# Patient Record
Sex: Female | Born: 1950 | Race: Black or African American | Hispanic: No | Marital: Single | Smoking: Current every day smoker
Health system: Southern US, Community
[De-identification: ages and names within clinical notes are randomized; demographics above are authoritative.]

## PROBLEM LIST (undated history)

## (undated) DIAGNOSIS — F32A Depression, unspecified: Secondary | ICD-10-CM

## (undated) DIAGNOSIS — K219 Gastro-esophageal reflux disease without esophagitis: Secondary | ICD-10-CM

## (undated) DIAGNOSIS — F419 Anxiety disorder, unspecified: Secondary | ICD-10-CM

## (undated) DIAGNOSIS — F329 Major depressive disorder, single episode, unspecified: Secondary | ICD-10-CM

## (undated) DIAGNOSIS — I10 Essential (primary) hypertension: Secondary | ICD-10-CM

## (undated) DIAGNOSIS — E785 Hyperlipidemia, unspecified: Secondary | ICD-10-CM

## (undated) HISTORY — DX: Essential (primary) hypertension: I10

## (undated) HISTORY — DX: Hyperlipidemia, unspecified: E78.5

## (undated) HISTORY — DX: Major depressive disorder, single episode, unspecified: F32.9

## (undated) HISTORY — DX: Depression, unspecified: F32.A

## (undated) HISTORY — DX: Anxiety disorder, unspecified: F41.9

## (undated) HISTORY — DX: Gastro-esophageal reflux disease without esophagitis: K21.9

---

## 2004-09-09 ENCOUNTER — Ambulatory Visit: Payer: Self-pay

## 2006-03-16 ENCOUNTER — Ambulatory Visit: Payer: Self-pay

## 2009-03-18 ENCOUNTER — Emergency Department: Payer: Self-pay | Admitting: Emergency Medicine

## 2011-06-21 ENCOUNTER — Emergency Department: Payer: Self-pay | Admitting: Emergency Medicine

## 2012-08-08 DIAGNOSIS — D259 Leiomyoma of uterus, unspecified: Secondary | ICD-10-CM | POA: Insufficient documentation

## 2013-09-06 ENCOUNTER — Ambulatory Visit: Payer: Self-pay | Admitting: Gastroenterology

## 2013-09-09 LAB — PATHOLOGY REPORT

## 2014-01-01 DIAGNOSIS — K219 Gastro-esophageal reflux disease without esophagitis: Secondary | ICD-10-CM | POA: Insufficient documentation

## 2014-06-13 ENCOUNTER — Emergency Department: Payer: Self-pay | Admitting: Emergency Medicine

## 2015-08-20 DIAGNOSIS — I890 Lymphedema, not elsewhere classified: Secondary | ICD-10-CM | POA: Insufficient documentation

## 2015-08-23 ENCOUNTER — Ambulatory Visit: Payer: Medicare HMO | Attending: Orthopaedic Surgery | Admitting: Occupational Therapy

## 2015-08-23 ENCOUNTER — Encounter: Payer: Self-pay | Admitting: Occupational Therapy

## 2015-08-23 VITALS — Ht 66.0 in | Wt 265.0 lb

## 2015-08-23 DIAGNOSIS — I890 Lymphedema, not elsewhere classified: Secondary | ICD-10-CM

## 2015-08-24 NOTE — Therapy (Signed)
[place] MAIN [facility] SERVICES [address] [place], [place], [address] Phone: [phone]   Fax:  [phone]  Occupational Therapy Evaluation  Patient Details  Name: Heather Hartman MRN: [id number] Date of Birth: [birth year] No Data Recorded  Encounter Date: 08/23/2015      OT End of Session - 08/24/15 1553    Visit Number 1   Number of Visits 36   Date for OT Re-Evaluation 11/23/15   OT Start Time 1100   OT Stop Time 1201   OT Time Calculation (min) 61 min   Equipment Utilized During Treatment Lymphedema Self-Care Workbook   Activity Tolerance Patient tolerated treatment well;No increased pain   Behavior During Therapy [facility] for tasks assessed/performed      Past Medical History  Diagnosis Date  . Anxiety   . GERD (gastroesophageal reflux disease)   . Hypertension   . Hyperlipidemia   . Depression     History reviewed. No pertinent past surgical history.  Filed Vitals:   08/23/15 1121  Height: 5\' 6"  (1.676 m)  Weight: 265 lb (120.203 kg)        Subjective Assessment - 08/24/15 1259    Subjective  (p) Pt is referred for OT evaluation and treatment of RLE lymphedema by Purcell Nails, MD of [facility]. Pt denies prior LE treatment and does not currently wear compression garments. Pt reports sudden onset of RLE swelling after nbite between small and 4th toe on the R foot ~ 10 years ago. Bite resulted in cellulitis infection requiring week Goin hospitalization. Pt states swelling has been ongoing since onset and has worsened over time. It does not resolve w/ elevation.   Pertinent History (p) Obesity, Tobacco abuse, plantar fasciitis   Limitations (p) decreased basic and instrumental ADL performance, difficulty standing and walking, decreased balance 2/2 body assymetry, difficuty fitting lower body clothing and street shoes, decreased social participation   Patient Stated Goals (p) reduce swelling and pai. keep them  from getting worse soI can do more   Currently in Pain? 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   Onset Date 05/25/04   Prior Therapy none   Balance Screen   Has the patient fallen in the past 6 months No   Prior Function   Level of Independence Independent   Vocation Retired   Leisure family gathering and social participation with friends in community   ADL   Lower Body Dressing Other (comment)  RLE limb swelling limits ability to fit street shoes and B c   IADL   Shopping Shops independently for [employer]   Written Expression   Dominant Hand Right   Observation/Other Assessments   Observations by visual assessment RLE ~ 25% > in volume than LLE below knee   Skin Integrity Moderate, dense, spongy palpable fibrosis in RLE below knee. Tissue is non pitting with strong positive stemmer. Skin is well hydrated          LYMPHEDEMA/ONCOLOGY QUESTIONNAIRE - 08/24/15 1602    What other symptoms do you have   Are you Having Heaviness or Tightness Yes   Are you having Pain Yes   Are you having pitting edema No   Is it Hard or Difficult finding clothes that fit Yes   Do you have infections  Yes   Stemmer Sign Yes   Lymphedema Stage   Stage STAGE 2 SPONTANEOUSLY IRREVERSIBLE   Lymphedema Assessments   Lymphedema Assessments Lower extremities   Right Lower Extremity Lymphedema   Other BLE comparative limb volumetrics TBA Rx visit 1                       OT Education - 08/24/15 1553    Education provided Yes   Education Details Provided Pt/caregiver skilled education and ADL training throughout visit for lymphedema etiology, progression, and treatment including Intensive and Management Phase Complete Decongestive Therapy (CDT)  Discussed lymphedema precautions, cellulitis risk, and all CDT and LE self-care components, including compression wrapping/  garments & devices, lymphatic pumping ther ex, simple self-MLD, and skin care. Provided printed Lymphedema Workbook for reference.   Person(s) Educated Patient   Methods Explanation;Demonstration;Tactile cues;Verbal cues;Handout   Comprehension Verbalized understanding;Need further instruction             OT [place] Term Goals - 08/24/15 1605    OT [place] TERM GOAL #1   Title Lymphedema (LE) self-care: Pt able to apply multi layered, gradient compression wraps independently within 2 weeks to achieve optimal limb volume reduction.   Baseline dependent   Time 2   Period Weeks   Status New   OT Odenthal TERM GOAL #2   Title Pt to achieve at least 10% RLE limb volume reductions during Intensive CDT to limit LE progression, decrease infection risk and fall risk, to reduce pain/ discomfort, and to improve safe ambulation and functional mobility.   Baseline dependent   Time 12   Period Weeks   Status New   OT [place] TERM GOAL #3   Title Pt >/= 85 % compliant with all daily, LE self-care protocols for home program, including simple self-manual lymphatic drainage (MLD), skin care, lymphatic pumping the ex, skin care, and donning/ doffing compression wraps and garments with needed level of caregiver assistance to limit LE progression and further functional decline.     Baseline dependent   Time 12   Period Weeks   Status New   OT [place] TERM GOAL #4   Title Pt to tolerate daily compression wraps, garments and devices in keeping w/ prescribed wear regime within 1 week of issue date to progress and retain clinical and functional gains and to limit LE progression.   Baseline dependent   Time 12   Period Weeks   Status New   OT [place]i TERM GOAL #5   Title During Management Phase CDT Pt to sustain limb volume reductions achieved during Intensive Phase CDT within 5% utilizing LE self-care protocols, appropriate compression garments/ devices, and needed level of caregiver assistance.   Baseline dependent    Time 6   Period Months   Status New               Plan - 08/24/15 1610    Clinical Impression Statement Pt presents with moderate, stage II, RLE lymphedema (LE) 2/2 unidentified insect bite ~ 10 years ago.  Chronic, progressive, BLE LE limits Pt's functional performance of basic and instrumental ADLs, participation in productive and leisure activities, life role performance, functional mobility and ambulation, and participation in social and community activities. Skilled Occupational Therapy for LE care is medically necessary to reduce uncontrolled swelling and associated pain, to decrease infection and falls risk, to improve knowledge and performance of LE self-care, to fit with appropriate, accessible, custom, BLE compression garments and devices, and to  limit further progression. Without skilled Occupational Therapy for Intensive and Management phase Complete Decongestive Therapy (CDT) this patient's condition is likely to worsen and further functional decline is expected.   Rehab Potential Good   Clinical Impairments Affecting Rehab Potential obesity, dense tissue fibrosis, chronic pain, plantar fasciitis   OT Frequency 3x / week   OT Duration 12 weeks   OT Treatment/Interventions Self-care/ADL training;DME and/or AE instruction;Manual lymph drainage;Patient/family education;Compression bandaging;Therapeutic exercises;Therapeutic exercise;Therapeutic activities;Manual Therapy   Plan fit w/ ccl 3 Elvarex knee high- flat knit custom stocking, with ccl 2 Elvarex toe cap CCl 2, and convoluted foam knee length HOS device to limit fibrosis formation during HOS- consider Reid classic or Comfort w/ upgraded foam.   OT Home Exercise Plan lymphatic pumping therex- 2 x q day- 10 reps in order bilaterally   Recommended Other Services daily skin care  w/ low pH lotion   Consulted and Agree with Plan of Care Patient      Patient will benefit from skilled therapeutic intervention in order to improve  the following deficits and impairments:  Abnormal gait, Decreased knowledge of use of DME, Decreased skin integrity, Increased edema, Impaired flexibility, Pain, Decreased mobility, Decreased activity tolerance, Decreased range of motion, Decreased balance, Decreased knowledge of precautions, Difficulty walking  Visit Diagnosis: Lymphedema, not elsewhere classified      G-Codes - Sep 21, 2015 1604    Functional Assessment Tool Used Clinical observation and examination, medical records review, Pt and caregiver interview, comparative limb volumetrics   Functional Limitation Self care   Self Care Current Status [id number]) 100 percent impaired, limited or restricted   Self Care Goal Status [id number]) At least 1 percent but less than 20 percent impaired, limited or restricted      Problem List There are no active problems to display for this patient.   Ansel Bong 09-21-2015, 4:19 PM  [place] MAIN [facility] SERVICES [address] [place], [place], [address] Phone: [phone]   Fax:  [phone]  Name: Heather Hartman MRN: [id number] Date of Birth: 02/11/1951

## 2015-08-24 NOTE — Patient Instructions (Signed)

## 2015-08-29 ENCOUNTER — Ambulatory Visit: Payer: Medicare HMO | Attending: Orthopaedic Surgery | Admitting: Occupational Therapy

## 2015-08-29 DIAGNOSIS — I890 Lymphedema, not elsewhere classified: Secondary | ICD-10-CM | POA: Diagnosis present

## 2015-08-29 NOTE — Patient Instructions (Signed)
LE instructions and precautions as established- see initial eval.   

## 2015-08-29 NOTE — Therapy (Signed)
[place] MAIN [facility] SERVICES [address] [place], [place], [address] Phone: [phone]   Fax:  [phone]  Occupational Therapy Treatment  Patient Details  Name: Heather Hartman MRN: [id number] Date of Birth: 09/14/1950 No Data Recorded  Encounter Date: 08/29/2015      OT End of Session - 08/29/15 1528    Visit Number 2   Number of Visits 36   Date for OT Re-Evaluation 11/23/15   OT Start Time 1119   OT Stop Time 1224   OT Time Calculation (min) 65 min   Equipment Utilized During Treatment Lymphedema Self-Care Workbook   Activity Tolerance Patient tolerated treatment well;No increased pain   Behavior During Therapy [facility] for tasks assessed/performed      Past Medical History  Diagnosis Date  . Anxiety   . GERD (gastroesophageal reflux disease)   . Hypertension   . Hyperlipidemia   . Depression     No past surgical history on file.  There were no vitals filed for this visit.           LYMPHEDEMA/ONCOLOGY QUESTIONNAIRE - 08/29/15 1531    Right Lower Extremity Lymphedema   Other RLE A-D ( below knee) limb volume = 6,455.35 ml. A-G (toes - mid thigh) volume = 8,732.21 ml   Other Limb volume differential (LVD) measures 26.58%, R>L, below the knee, and  15.51%  from toes to mid thjigh. Typical WNL LVD is usually , 5%.   Left Lower Extremity Lymphedema   Other lLE A-D ( below knee) limb volume = 4737.14 ml. A-G (toes - mid thigh) volume = 7377.69 mlRLE A-D ( below knee) limb volume = 6,455.35 ml. A-G (toes - mid thigh) volume = 8,732.21 ml                 OT Treatments/Exercises (OP) - 08/29/15 0001    ADLs   ADL Education Given Yes   Manual Therapy   Manual Therapy Edema management;Compression Bandaging   Manual therapy comments Completed BLE comparative limb volumetrics   Compression Bandaging RLE gradient compression wraps applied from toes to below knee: toe wrap x1 under cotton stockinett; 8 cm x 5 m x 1 to  foot and ankle, 10 cm  x 5 m x 1, 12 cm x5 cm x 1, applied circumferentially in custommary layered gradient configuration  over .04 x 10 cm x 5 m Rosidol Soft x 1.5 .rolls.                 OT Education - 08/29/15 1523    Education provided Yes   Education Details Emphasis of LE ADL training today on patient edu for proper compression wraps application using  circumferential, gradient techniques and proper positioning.    Person(s) Educated Patient   Methods Explanation;Demonstration;Handout   Comprehension Verbalized understanding;Need further instruction             OT [place] Term Goals - 08/24/15 1605    OT Eisman TERM GOAL #1   Title Lymphedema (LE) self-care: Pt able to apply multi layered, gradient compression wraps independently within 2 weeks to achieve optimal limb volume reduction.   Baseline dependent   Time 2   Period Weeks   Status New   OT Se[place] TERM GOAL #2   Title Pt to achieve at least 10% RLE limb volume reductions during Intensive CDT to limit LE progression, decrease infection risk and fall risk, to reduce pain/ discomfort, and to improve safe ambulation and functional  mobility.   Baseline dependent   Time 12   Period Weeks   Status New   OT [place] TERM GOAL #3   Title Pt >/= 85 % compliant with all daily, LE self-care protocols for home program, including simple self-manual lymphatic drainage (MLD), skin care, lymphatic pumping the ex, skin care, and donning/ doffing compression wraps and garments with needed level of caregiver assistance to limit LE progression and further functional decline.     Baseline dependent   Time 12   Period Weeks   Status New   OT [place]rette TERM GOAL #4   Title Pt to tolerate daily compression wraps, garments and devices in keeping w/ prescribed wear regime within 1 week of issue date to progress and retain clinical and functional gains and to limit LE progression.   Baseline dependent   Time 12   Period Weeks   Status New   OT  [place] TERM GOAL #5   Title During Management Phase CDT Pt to sustain limb volume reductions achieved during Intensive Phase CDT within 5% utilizing LE self-care protocols, appropriate compression garments/ devices, and needed level of caregiver assistance.   Baseline dependent   Time 6   Period Months   Status New               Plan - 08/29/15 1528    Clinical Impression Statement Pt tolerated anatomical measurements and knee length RLE compression wraps in clinic today w/out difficulty. Initial comparative volumetrics reveal pretty significant 26.58% limb volume differential, R>L, below the knee verifying moderate  lymphedema designation. Next session we'll continue Pt edu for self wrapping.   Rehab Potential Good   Clinical Impairments Affecting Rehab Potential obesity, dense tissue fibrosis, chronic pain, plantar fasciitis   OT Frequency 3x / week   OT Duration 12 weeks   OT Treatment/Interventions Self-care/ADL training;DME and/or AE instruction;Manual lymph drainage;Patient/family education;Compression bandaging;Therapeutic exercises;Therapeutic exercise;Therapeutic activities;Manual Therapy   OT Home Exercise Plan lymphatic pumping therex- 2 x q day- 10 reps in order bilaterally   Consulted and Agree with Plan of Care Patient      Patient will benefit from skilled therapeutic intervention in order to improve the following deficits and impairments:  Abnormal gait, Decreased knowledge of use of DME, Decreased skin integrity, Increased edema, Impaired flexibility, Pain, Decreased mobility, Decreased activity tolerance, Decreased range of motion, Decreased balance, Decreased knowledge of precautions, Difficulty walking  Visit Diagnosis: Lymphedema, not elsewhere classified    Problem List There are no active problems to display for this patient.  Andrey Spearman, MS, OTR/L, [facility] 08/29/2015 3:38 PM  [place] MAIN [facility] SERVICES [address] [place], [place], [address] Phone: [phone]   Fax:  [phone]  Name: Heather Hartman MRN: [id number] Date of Birth: 03-Jan-1951

## 2015-08-31 ENCOUNTER — Ambulatory Visit: Payer: Medicare HMO | Admitting: Occupational Therapy

## 2015-08-31 DIAGNOSIS — I890 Lymphedema, not elsewhere classified: Secondary | ICD-10-CM

## 2015-08-31 NOTE — Patient Instructions (Signed)
LE instructions and precautions as established- see initial eval.   

## 2015-08-31 NOTE — Therapy (Signed)
[place] MAIN [facility] SERVICES [address] [place], [place], [address] Phone: [phone]   Fax:  [phone]  Occupational Therapy Treatment  Patient Details  Name: Heather Hartman MRN: [id number] Date of Birth: 1950/06/08 No Data Recorded  Encounter Date: 08/31/2015      OT End of Session - 08/31/15 1220    Visit Number 3   Number of Visits 36   Date for OT Re-Evaluation 11/23/15   OT Start Time 1102   OT Stop Time 1214   OT Time Calculation (min) 72 min   Equipment Utilized During Treatment Lymphedema Self-Care Workbook   Activity Tolerance Patient tolerated treatment well;No increased pain   Behavior During Therapy [facility] for tasks assessed/performed      Past Medical History  Diagnosis Date  . Anxiety   . GERD (gastroesophageal reflux disease)   . Hypertension   . Hyperlipidemia   . Depression     No past surgical history on file.  There were no vitals filed for this visit.      Subjective Assessment - 08/31/15 1221    Subjective  Pt presents for OT visist 3 to treat RLE secondary lymphedema with compression wraps in place. Pt tolerated wraps during visit interval with minimal difficulty.Emphasis of visit today is on teaching LE self care.   Pertinent History Obesity, Tobacco abuse, plantar fasciitis   Limitations decreased basic and instrumental ADL performance, difficulty standing and walking, decreased balance 2/2 body assymetry, difficuty fitting lower body clothing and street shoes, decreased social participation   Patient Stated Goals reduce swelling and pai. keep them from getting worse soI can do more                      OT Treatments/Exercises (OP) - 08/31/15 0001    ADLs   ADL Education Given Yes   Manual Therapy   Manual Therapy Edema management;Compression Bandaging   Compression Bandaging RLE gradient compression wraps applied from toes to below knee: toe wrap x1 under cotton stockinett; 8 cm x  5 m x 1 to foot and ankle, 10 cm  x 5 m x 1, 12 cm x5 cm x 1, applied circumferentially in custommary layered gradient configuration  over .04 x 10 cm x 5 m Rosidol Soft x 1.5 .rolls.                 OT Education - 08/31/15 1219    Education provided Yes   Education Details Emphasis today on proper gradient compression application using proper technique, and on BLE ymphatic pumping there ex including seated, standing and supine variations. All hand outs given.Good return for there ex.   Person(s) Educated Patient   Methods Explanation;Demonstration;Tactile cues;Verbal cues;Handout   Comprehension Verbalized understanding;Returned demonstration;Verbal cues required;Tactile cues required;Need further instruction             OT [place] Term Goals - 08/24/15 1605    OT [place]ner TERM GOAL #1   Title Lymphedema (LE) self-care: Pt able to apply multi layered, gradient compression wraps independently within 2 weeks to achieve optimal limb volume reduction.   Baseline dependent   Time 2   Period Weeks   Status New   OT [place] TERM GOAL #2   Title Pt to achieve at least 10% RLE limb volume reductions during Intensive CDT to limit LE progression, decrease infection risk and fall risk, to reduce pain/ discomfort, and to improve safe ambulation and functional mobility.   Baseline  dependent   Time 12   Period Weeks   Status New   OT [place] TERM GOAL #3   Title Pt >/= 85 % compliant with all daily, LE self-care protocols for home program, including simple self-manual lymphatic drainage (MLD), skin care, lymphatic pumping the ex, skin care, and donning/ doffing compression wraps and garments with needed level of caregiver assistance to limit LE progression and further functional decline.     Baseline dependent   Time 12   Period Weeks   Status New   OT [place] TERM GOAL #4   Title Pt to tolerate daily compression wraps, garments and devices in keeping w/ prescribed wear regime within 1 week of issue  date to progress and retain clinical and functional gains and to limit LE progression.   Baseline dependent   Time 12   Period Weeks   Status New   OT Nell TERM GOAL #5   Title During Management Phase CDT Pt to sustain limb volume reductions achieved during Intensive Phase CDT within 5% utilizing LE self-care protocols, appropriate compression garments/ devices, and needed level of caregiver assistance.   Baseline dependent   Time 6   Period Months   Status New               Plan - 08/31/15 1221    Clinical Impression Statement Upon removing compression wraps this morning Limb volume is increased in R foot is clearly increased as Pt pushed toe wraps up underneath foot bandages into arch area. Leg appears unchanged volumetrically and density  is not changed palpably. By end of session Pt was able to apply compression wraps to  knee w/ max assist. Pt independent with lymphatic pumping ther ex aftewr skilled training/ Pt will practice over the weekend. Since she lives less than 10 minutes from clinic she will change her bathing routine to mornings  so she wont have to rewrap on treatment days.   Rehab Potential Good   Clinical Impairments Affecting Rehab Potential obesity, dense tissue fibrosis, chronic pain, plantar fasciitis   OT Frequency 3x / week   OT Duration 12 weeks   OT Treatment/Interventions Self-care/ADL training;DME and/or AE instruction;Manual lymph drainage;Patient/family education;Compression bandaging;Therapeutic exercises;Therapeutic exercise;Therapeutic activities;Manual Therapy   OT Home Exercise Plan lymphatic pumping therex- 2 x q day- 10 reps in order bilaterally   Consulted and Agree with Plan of Care Patient      Patient will benefit from skilled therapeutic intervention in order to improve the following deficits and impairments:  Abnormal gait, Decreased knowledge of use of DME, Decreased skin integrity, Increased edema, Impaired flexibility, Pain, Decreased  mobility, Decreased activity tolerance, Decreased range of motion, Decreased balance, Decreased knowledge of precautions, Difficulty walking  Visit Diagnosis: Lymphedema, not elsewhere classified    Problem List There are no active problems to display for this patient.   Andrey Spearman, MS, OTR/L, [facility] 08/31/2015 12:26 PM  [place] MAIN [facility] SERVICES [address] [place], [place], [address] Phone: [phone]   Fax:  [phone]  Name: Heather Hartman MRN: [id number] Date of Birth: [birth year]

## 2015-09-03 ENCOUNTER — Ambulatory Visit: Payer: Medicare HMO | Admitting: Occupational Therapy

## 2015-09-05 ENCOUNTER — Ambulatory Visit: Payer: Medicare HMO | Admitting: Occupational Therapy

## 2015-09-05 DIAGNOSIS — I890 Lymphedema, not elsewhere classified: Secondary | ICD-10-CM

## 2015-09-05 NOTE — Patient Instructions (Signed)
LE instructions and precautions as established- see initial eval.   

## 2015-09-05 NOTE — Therapy (Signed)
[place] MAIN [facility] SERVICES [address] [place], [place], [address] Phone: [phone]   Fax:  [phone]  Occupational Therapy Treatment  Patient Details  Name: Heather Hartman MRN: [id number] Date of Birth: 01-19-1951 No Data Recorded  Encounter Date: 09/05/2015      OT End of Session - 09/05/15 1216    Visit Number 4   Number of Visits 36   Date for OT Re-Evaluation 11/23/15   OT Start Time 1115   OT Stop Time 1200   OT Time Calculation (min) 45 min   Equipment Utilized During Treatment Lymphedema Self-Care Workbook   Activity Tolerance Patient tolerated treatment well;No increased pain   Behavior During Therapy [facility] for tasks assessed/performed      Past Medical History  Diagnosis Date  . Anxiety   . GERD (gastroesophageal reflux disease)   . Hypertension   . Hyperlipidemia   . Depression     No past surgical history on file.  There were no vitals filed for this visit.      Subjective Assessment - 09/05/15 1213    Subjective  Pt presents for OT visist 4 to treat RLE secondary lymphedema with compression wraps in place. Pt reports wraps got lose and came off a few hours after last visit.   Pertinent History Obesity, Tobacco abuse, plantar fasciitis   Limitations decreased basic and instrumental ADL performance, difficulty standing and walking, decreased balance 2/2 body assymetry, difficuty fitting lower body clothing and street shoes, decreased social participation   Patient Stated Goals reduce swelling and pai. keep them from getting worse soI can do more   Currently in Pain? No/denies                      OT Treatments/Exercises (OP) - 09/05/15 0001    ADLs   ADL Education Given Yes   Manual Therapy   Manual Therapy Edema management;Compression Bandaging   Compression Bandaging RLE gradient compression wraps applied from toes to below knee: toe wrap x1 under cotton stockinett; 8 cm x 5 m x 1 to foot  and ankle, 10 cm  x 5 m x 1, 12 cm x5 cm x 1, applied circumferentially in custommary layered gradient configuration  over .04 x 10 cm x 5 m Rosidol Soft x 1.5 .rolls.                 OT Education - 09/05/15 1214    Education provided Yes   Education Details Continued LE ADL training today for proper compression wraps application using  circumferential, gradient techniques and proper positioning.     Person(s) Educated Patient   Methods Explanation;Demonstration;Tactile cues;Verbal cues;Handout   Comprehension Verbalized understanding;Returned demonstration;Verbal cues required;Tactile cues required;Need further instruction             OT Michiels Term Goals - 08/24/15 1605    OT [place] TERM GOAL #1   Title Lymphedema (LE) self-care: Pt able to apply multi layered, gradient compression wraps independently within 2 weeks to achieve optimal limb volume reduction.   Baseline dependent   Time 2   Period Weeks   Status New   OT Mc[place]e TERM GOAL #2   Title Pt to achieve at least 10% RLE limb volume reductions during Intensive CDT to limit LE progression, decrease infection risk and fall risk, to reduce pain/ discomfort, and to improve safe ambulation and functional mobility.   Baseline dependent   Time 12   Period Weeks  Status New   OT Todt TERM GOAL #3   Title Pt >/= 85 % compliant with all daily, LE self-care protocols for home program, including simple self-manual lymphatic drainage (MLD), skin care, lymphatic pumping the ex, skin care, and donning/ doffing compression wraps and garments with needed level of caregiver assistance to limit LE progression and further functional decline.     Baseline dependent   Time 12   Period Weeks   Status New   OT Gravelle TERM GOAL #4   Title Pt to tolerate daily compression wraps, garments and devices in keeping w/ prescribed wear regime within 1 week of issue date to progress and retain clinical and functional gains and to limit LE progression.    Baseline dependent   Time 12   Period Weeks   Status New   OT Dobie TERM GOAL #5   Title During Management Phase CDT Pt to sustain limb volume reductions achieved during Intensive Phase CDT within 5% utilizing LE self-care protocols, appropriate compression garments/ devices, and needed level of caregiver assistance.   Baseline dependent   Time 6   Period Months   Status New               Plan - 09/05/15 1217    Clinical Impression Statement No change in swelling or tissue integrity today. Wraps became lose and fell off soon after treatment on Monday, which I suspect was 2/2 to rapid lymphatic decongestiion. Pt did not attempt to re-apply. By end of session pt able to apply wraps using proper techniques with moderate assistance..   Rehab Potential Good   Clinical Impairments Affecting Rehab Potential obesity, dense tissue fibrosis, chronic pain, plantar fasciitis   OT Frequency 3x / week   OT Duration 12 weeks   OT Treatment/Interventions Self-care/ADL training;DME and/or AE instruction;Manual lymph drainage;Patient/family education;Compression bandaging;Therapeutic exercises;Therapeutic exercise;Therapeutic activities;Manual Therapy   OT Home Exercise Plan lymphatic pumping therex- 2 x q day- 10 reps in order bilaterally   Consulted and Agree with Plan of Care Patient      Patient will benefit from skilled therapeutic intervention in order to improve the following deficits and impairments:  Abnormal gait, Decreased knowledge of use of DME, Decreased skin integrity, Increased edema, Impaired flexibility, Pain, Decreased mobility, Decreased activity tolerance, Decreased range of motion, Decreased balance, Decreased knowledge of precautions, Difficulty walking  Visit Diagnosis: Lymphedema, not elsewhere classified    Problem List There are no active problems to display for this patient.   Heather Spearman, MS, OTR/L, [facility] 09/05/2015 12:19 PM  [place] MAIN [facility] SERVICES [address] [place], [place], [address] Phone: [phone]   Fax:  [phone]  Name: Heather Hartman MRN: [id number] Date of Birth: Jan 27, 1951

## 2015-09-07 ENCOUNTER — Ambulatory Visit: Payer: Medicare HMO | Admitting: Occupational Therapy

## 2015-09-10 ENCOUNTER — Ambulatory Visit: Payer: Medicare HMO | Admitting: Occupational Therapy

## 2015-09-12 ENCOUNTER — Ambulatory Visit: Payer: Medicare HMO | Admitting: Occupational Therapy

## 2015-09-12 ENCOUNTER — Encounter: Payer: Self-pay | Admitting: Occupational Therapy

## 2015-09-12 DIAGNOSIS — I890 Lymphedema, not elsewhere classified: Secondary | ICD-10-CM

## 2015-09-12 NOTE — Therapy (Deleted)
[place] MAIN [facility] SERVICES [address] [place], [place], [address] Phone: [phone]   Fax:  [phone]  Sep 12, 2015     Occupational Therapy Discharge Summary   Patient: Heather Hartman MRN: [id number] Date of Birth: 01-16-1951  Diagnosis: Lymphedema, not elsewhere classified  No Data Recorded  The above patient had been seen in Occupational Therapy *** times of *** treatments scheduled with *** no shows and *** cancellations.  The treatment consisted of *** The patient is: {improved/worse/unchanged:[id number]}  Subjective: ***  Discharge Findings: ***  Functional Status at Discharge: ***  {TMLYY:[id number]}      Plan - 09/12/15 0954    Clinical Impression Statement Heather Hartman is discharged from Occupational Therapy for lymphedema care to address BLE swelling.. Pt had attended 4 of 36 visits. Pt cancelled remaining scheduled appointments stating that it was not helpful to her. She was educated on LE self-care, but she had not mastered any needed skills tat DC. Pt  was informed of  lymphedema precautions and  was provided with The Lymphedema Self-Care Workbook for reference. No OT goals met. Pt remains an appropriate candidate for treatment. Should she wish to return to OT for LE care, we will need a new perscription. Thank you for your referral.   Rehab Potential Good   Clinical Impairments Affecting Rehab Potential obesity, dense tissue fibrosis, chronic pain, plantar fasciitis   OT Frequency 3x / week   OT Duration 12 weeks   OT Treatment/Interventions Self-care/ADL training;DME and/or AE instruction;Manual lymph drainage;Patient/family education;Compression bandaging;Therapeutic exercises;Therapeutic exercise;Therapeutic activities;Manual Therapy   OT Home Exercise Plan lymphatic pumping therex- 2 x q day- 10 reps in order bilaterally   Consulted and Agree with Plan of Care Patient      Sincerely,  Andrey Spearman,  MS, OTR/L, [facility] 09/12/2015 10:04 AM  CC @CCLISTRESTNAME @  [place] [address] [place], [place], [address] Phone: [phone]   Fax:  [phone]  Patient: Heather Hartman MRN: [id number] Date of Birth: [birth year]

## 2015-09-12 NOTE — Therapy (Signed)
[place] MAIN [facility] SERVICES [address] [place], [place], [address] Phone: [phone]   Fax:  [phone]  Occupational Therapy Discharge Summary  Patient Details  Name: Heather Hartman MRN: [id number] Date of Birth: [birth year] No Data Recorded  Encounter Date: 09/12/2015    Past Medical History  Diagnosis Date  . Anxiety   . GERD (gastroesophageal reflux disease)   . Hypertension   . Hyperlipidemia   . Depression     No past surgical history on file.  There were no vitals filed for this visit.      Subjective Assessment - 09/12/15 0950    Subjective  Pt telephoned Rehab scheduler and csancelled all OT appointments for lymphedema care stating, " it is not helping me."   Pertinent History Obesity, Tobacco abuse, plantar fasciitis   Limitations decreased basic and instrumental ADL performance, difficulty standing and walking, decreased balance 2/2 body assymetry, difficuty fitting lower body clothing and street shoes, decreased social participation   Patient Stated Goals reduce swelling and pai. keep them from getting worse soI can do more                              OT Education - 09/12/15 0952    Education provided No   Education Details 2 initial treatment sessions devoted to pt education for LE self care, including self compression wrapping.Pat last visit.t had not mastered skills    Person(s) Educated Patient             OT Reeder Term Goals - 09/12/15 1000    OT [place]esk[place] TERM GOAL #1   Title Lymphedema (LE) self-care: Pt able to apply multi layered, gradient compression wraps independently within 2 weeks to achieve optimal limb volume reduction.   Baseline dependent   Time 2   Period Weeks   Status Not Met   OT Donner TERM GOAL #2   Title Pt to achieve at least 10% RLE limb volume reductions during Intensive CDT to limit LE progression, decrease infection risk and fall risk, to reduce pain/  discomfort, and to improve safe ambulation and functional mobility.   Baseline dependent   Time 12   Period Weeks   Status Not Met   OT Pasch TERM GOAL #3   Title Pt >/= 85 % compliant with all daily, LE self-care protocols for home program, including simple self-manual lymphatic drainage (MLD), skin care, lymphatic pumping the ex, skin care, and donning/ doffing compression wraps and garments with needed level of caregiver assistance to limit LE progression and further functional decline.     Baseline dependent   Time 12   Period Weeks   Status Not Met   OT [place] TERM GOAL #4   Title Pt to tolerate daily compression wraps, garments and devices in keeping w/ prescribed wear regime within 1 week of issue date to progress and retain clinical and functional gains and to limit LE progression.   Baseline dependent   Time 12   Period Weeks   Status Not Met   OT Usman TERM GOAL #5   Title During Management Phase CDT Pt to sustain limb volume reductions achieved during Intensive Phase CDT within 5% utilizing LE self-care protocols, appropriate compression garments/ devices, and needed level of caregiver assistance.   Baseline dependent   Time 6   Period Months   Status Not Met  Plan - 09/12/15 0954    Clinical Impression Statement Ms. Berthe Oley Standard is discharged from Occupational Therapy for lymphedema care to address BLE swelling.. Pt had attended 4 of 36 visits. Pt cancelled remaining scheduled appointments stating that it was not helpful to her. She was educated on LE self-care, but she had not mastered any needed skills tat DC. Pt  was informed of  lymphedema precautions and  was provided with The Lymphedema Self-Care Workbook for reference. No OT goals met. Pt remains an appropriate candidate for treatment. Should she wish to return to OT for LE care, we will need a new perscription. Thank you for your referral.   Rehab Potential Good   Clinical Impairments Affecting  Rehab Potential obesity, dense tissue fibrosis, chronic pain, plantar fasciitis   OT Frequency 3x / week   OT Duration 12 weeks   OT Treatment/Interventions Self-care/ADL training;DME and/or AE instruction;Manual lymph drainage;Patient/family education;Compression bandaging;Therapeutic exercises;Therapeutic exercise;Therapeutic activities;Manual Therapy   OT Home Exercise Plan lymphatic pumping therex- 2 x q day- 10 reps in order bilaterally   Consulted and Agree with Plan of Care Patient      Patient will benefit from skilled therapeutic intervention in order to improve the following deficits and impairments:  Abnormal gait, Decreased knowledge of use of DME, Decreased skin integrity, Increased edema, Impaired flexibility, Pain, Decreased mobility, Decreased activity tolerance, Decreased range of motion, Decreased balance, Decreased knowledge of precautions, Difficulty walking  Visit Diagnosis: Lymphedema, not elsewhere classified    Problem List There are no active problems to display for this patient.   Ansel Bong 09/12/2015, 10:05 AM  [place] MAIN [facility] SERVICES [address] [place], [place], [address] Phone: [phone]   Fax:  [phone]  Name: Heather Hartman MRN: [id number] Date of Birth: December 21, 1950

## 2015-09-12 NOTE — Patient Instructions (Signed)

## 2015-09-14 ENCOUNTER — Ambulatory Visit: Payer: Medicare HMO | Admitting: Occupational Therapy

## 2015-09-17 ENCOUNTER — Ambulatory Visit: Payer: Medicare HMO | Admitting: Occupational Therapy

## 2015-09-19 ENCOUNTER — Ambulatory Visit: Payer: Medicare HMO | Admitting: Occupational Therapy

## 2015-09-21 ENCOUNTER — Ambulatory Visit: Payer: Medicare HMO | Admitting: Occupational Therapy

## 2015-09-26 ENCOUNTER — Ambulatory Visit: Payer: Medicare HMO | Admitting: Occupational Therapy

## 2015-09-28 ENCOUNTER — Ambulatory Visit: Payer: Medicare HMO | Admitting: Occupational Therapy

## 2015-10-01 ENCOUNTER — Ambulatory Visit: Payer: Medicare HMO | Admitting: Occupational Therapy

## 2015-10-03 ENCOUNTER — Ambulatory Visit: Payer: Medicare HMO | Admitting: Occupational Therapy

## 2015-10-05 ENCOUNTER — Ambulatory Visit: Payer: Medicare HMO | Admitting: Occupational Therapy

## 2015-10-08 ENCOUNTER — Ambulatory Visit: Payer: Medicare HMO | Admitting: Occupational Therapy

## 2015-10-10 ENCOUNTER — Ambulatory Visit: Payer: Medicare HMO | Admitting: Occupational Therapy

## 2015-10-12 ENCOUNTER — Ambulatory Visit: Payer: Medicare HMO | Admitting: Occupational Therapy

## 2015-10-15 ENCOUNTER — Ambulatory Visit: Payer: Medicare HMO | Admitting: Occupational Therapy

## 2015-10-17 ENCOUNTER — Ambulatory Visit: Payer: Medicare HMO | Admitting: Occupational Therapy

## 2015-10-19 ENCOUNTER — Ambulatory Visit: Payer: Medicare HMO | Admitting: Occupational Therapy

## 2015-10-22 ENCOUNTER — Ambulatory Visit: Payer: Medicare HMO | Admitting: Occupational Therapy

## 2015-10-24 ENCOUNTER — Ambulatory Visit: Payer: Medicare HMO | Admitting: Occupational Therapy

## 2015-10-26 ENCOUNTER — Ambulatory Visit: Payer: Medicare HMO | Admitting: Occupational Therapy

## 2015-10-29 ENCOUNTER — Ambulatory Visit: Payer: Medicare HMO | Admitting: Occupational Therapy

## 2015-10-31 ENCOUNTER — Ambulatory Visit: Payer: Medicare HMO | Admitting: Occupational Therapy

## 2015-11-02 ENCOUNTER — Ambulatory Visit: Payer: Medicare HMO | Admitting: Occupational Therapy

## 2015-11-05 ENCOUNTER — Ambulatory Visit: Payer: Medicare HMO | Admitting: Occupational Therapy

## 2015-11-07 ENCOUNTER — Ambulatory Visit: Payer: Medicare HMO | Admitting: Occupational Therapy

## 2015-11-09 ENCOUNTER — Ambulatory Visit: Payer: Medicare HMO | Admitting: Occupational Therapy

## 2015-11-12 ENCOUNTER — Ambulatory Visit: Payer: Medicare HMO | Admitting: Occupational Therapy

## 2015-11-14 ENCOUNTER — Ambulatory Visit: Payer: Medicare HMO | Admitting: Occupational Therapy

## 2015-11-16 ENCOUNTER — Ambulatory Visit: Payer: Medicare HMO | Admitting: Occupational Therapy

## 2015-11-19 ENCOUNTER — Ambulatory Visit: Payer: Medicare HMO | Admitting: Occupational Therapy

## 2016-01-21 ENCOUNTER — Other Ambulatory Visit: Payer: Self-pay | Admitting: Family Medicine

## 2016-01-21 DIAGNOSIS — Z1231 Encounter for screening mammogram for malignant neoplasm of breast: Secondary | ICD-10-CM

## 2016-01-21 DIAGNOSIS — Z780 Asymptomatic menopausal state: Secondary | ICD-10-CM

## 2016-12-19 ENCOUNTER — Other Ambulatory Visit: Payer: Self-pay | Admitting: Family Medicine

## 2016-12-19 DIAGNOSIS — M7989 Other specified soft tissue disorders: Secondary | ICD-10-CM

## 2016-12-22 ENCOUNTER — Ambulatory Visit
Admission: RE | Admit: 2016-12-22 | Discharge: 2016-12-22 | Disposition: A | Discharge status: Home / Self Care | Payer: Medicare Other | Source: Ambulatory Visit | Attending: Family Medicine | Admitting: Family Medicine

## 2016-12-22 DIAGNOSIS — M7989 Other specified soft tissue disorders: Secondary | ICD-10-CM | POA: Diagnosis not present

## 2018-05-13 DIAGNOSIS — F17200 Nicotine dependence, unspecified, uncomplicated: Secondary | ICD-10-CM | POA: Insufficient documentation

## 2018-05-13 DIAGNOSIS — G8929 Other chronic pain: Secondary | ICD-10-CM | POA: Insufficient documentation

## 2018-05-13 DIAGNOSIS — M5416 Radiculopathy, lumbar region: Secondary | ICD-10-CM | POA: Insufficient documentation

## 2018-05-14 ENCOUNTER — Other Ambulatory Visit: Payer: Self-pay | Admitting: Family Medicine

## 2018-05-14 ENCOUNTER — Other Ambulatory Visit (HOSPITAL_COMMUNITY): Payer: Self-pay | Admitting: Family Medicine

## 2018-05-14 DIAGNOSIS — G8929 Other chronic pain: Secondary | ICD-10-CM

## 2018-05-14 DIAGNOSIS — M5441 Lumbago with sciatica, right side: Principal | ICD-10-CM

## 2018-05-14 DIAGNOSIS — M5442 Lumbago with sciatica, left side: Principal | ICD-10-CM

## 2018-05-28 ENCOUNTER — Encounter: Payer: Self-pay | Admitting: Radiology

## 2018-05-28 ENCOUNTER — Ambulatory Visit
Admission: RE | Admit: 2018-05-28 | Discharge: 2018-05-28 | Disposition: A | Discharge status: Home / Self Care | Payer: Medicare Other | Source: Ambulatory Visit | Attending: Family Medicine | Admitting: Family Medicine

## 2018-05-28 DIAGNOSIS — G8929 Other chronic pain: Secondary | ICD-10-CM | POA: Diagnosis present

## 2018-05-28 DIAGNOSIS — M5442 Lumbago with sciatica, left side: Secondary | ICD-10-CM | POA: Diagnosis present

## 2018-05-28 DIAGNOSIS — M5441 Lumbago with sciatica, right side: Secondary | ICD-10-CM | POA: Diagnosis present

## 2019-03-31 DIAGNOSIS — Z0000 Encounter for general adult medical examination without abnormal findings: Secondary | ICD-10-CM | POA: Insufficient documentation

## 2019-08-30 DIAGNOSIS — R52 Pain, unspecified: Secondary | ICD-10-CM | POA: Insufficient documentation

## 2019-12-09 DIAGNOSIS — Z66 Do not resuscitate: Secondary | ICD-10-CM | POA: Insufficient documentation

## 2020-05-03 ENCOUNTER — Encounter: Payer: Self-pay | Admitting: Surgery

## 2020-05-03 ENCOUNTER — Ambulatory Visit (INDEPENDENT_AMBULATORY_CARE_PROVIDER_SITE_OTHER): Payer: Medicare Other | Admitting: Surgery

## 2020-05-03 ENCOUNTER — Ambulatory Visit: Payer: PRIVATE HEALTH INSURANCE | Admitting: Surgery

## 2020-05-03 ENCOUNTER — Ambulatory Visit (INDEPENDENT_AMBULATORY_CARE_PROVIDER_SITE_OTHER): Payer: Medicare Other

## 2020-05-03 VITALS — BP 171/103 | HR 101 | Ht 66.0 in | Wt 265.0 lb

## 2020-05-03 DIAGNOSIS — M25552 Pain in left hip: Secondary | ICD-10-CM | POA: Diagnosis not present

## 2020-05-03 DIAGNOSIS — M48062 Spinal stenosis, lumbar region with neurogenic claudication: Secondary | ICD-10-CM | POA: Diagnosis not present

## 2020-05-03 DIAGNOSIS — M25551 Pain in right hip: Secondary | ICD-10-CM | POA: Diagnosis not present

## 2020-05-03 DIAGNOSIS — M25559 Pain in unspecified hip: Secondary | ICD-10-CM

## 2020-05-03 DIAGNOSIS — M4316 Spondylolisthesis, lumbar region: Secondary | ICD-10-CM

## 2020-05-03 NOTE — Progress Notes (Signed)
Office Visit Note   Patient: Heather Hartman           Date of Birth: 1951-02-18           MRN: [id number] Visit Date: 05/03/2020              Requested by: Medicine, [facility] [address] [place],   [id number] PCP: Medicine, [facility] Family   Assessment & Plan: Visit Diagnoses:  1. Hip pain   2. Spondylolisthesis, lumbar region   3. Neurogenic claudication due to lumbar spinal stenosis   4. Spondylolisthesis of lumbar region     Plan: With patient's ongoing symptoms that has failed multiple modes of conservative treatment over the last couple years I recommend repeating lumbar MRI and comparing to the study that was done May 28, 2018.  I think she has had some progression of her lumbar degenerative changes that would account for her increased symptoms.  She will follow-up with Dr. Louanne Skye in 3 weeks for recheck to review her study and discuss treatment options.  Patient asked for something for pain and advised her to continue the medications that she is currently on.  Also advised patient that she needs to get reestablished with a primary care physician since she stopped seeing Dr. Netty Starring in [place].  I advised patient that ultimately may come down her needing surgical intervention with multilevel fusion and she will need to get reestablished with a primary care physician because she definitely will need at minimum medical clearance.  Patient voices understanding.  Follow-Up Instructions: Return in about 3 weeks (around 05/24/2020) for WITH DR NITKA TO REVIEW LUMBAR MRI .   Orders:  Orders Placed This Encounter  Procedures  . XR Lumbar Spine 2-3 Views  . MR Lumbar Spine w/o contrast   No orders of the defined types were placed in this encounter.     Procedures: No procedures performed   Clinical Data: No additional findings.   Subjective: Chief Complaint  Patient presents with  . Right Hip - Pain  . Left Hip - Pain    HPI 70 year old  black female who is a new patient to the office comes in with complaints of worsening low back pain, bilateral lower extremity radiculopathy and neurogenic claudication.  Patient states that she has had off-and-on issues with her back for several years.  She has been followed by Dr. Netty Starring with [facility] in [facility].  Last seen by him December 09, 2019.  He ordered a lumbar MRI scan that was done May 28, 2018 and that showed:  EXAM: MRI LUMBAR SPINE WITHOUT CONTRAST  TECHNIQUE: Multiplanar, multisequence MR imaging of the lumbar spine was performed. No intravenous contrast was administered.  COMPARISON:  None available.  FINDINGS: Segmentation: Standard. Lowest well-formed disc labeled the L5-S1 level.  Alignment: 2 mm anterolisthesis of L3 on L4. 7 mm anterolisthesis of L4 on L5. 3 mm retrolisthesis of L5 on S1. Vertebral bodies otherwise normally aligned with preservation of the normal lumbar lordosis.  Vertebrae: Vertebral body heights maintained without evidence for acute or chronic fracture. Bone marrow signal intensity within normal limits. No discrete or worrisome osseous lesions. Reactive endplate changes present about the L5-S1 interspace. Possible reactive endplate changes partially visualized about the T10-11 interspace as well. No other abnormal marrow edema.  Conus medullaris and cauda equina: Conus extends to the L1-2 level. Conus medullaris within normal limits. Nerve roots of the cauda equina somewhat undulating superior to the L4-5 level  due to stenosis.  Paraspinal and other soft tissues: Paraspinous soft tissues within normal limits. Subcentimeter T2 hyperintense cyst noted within the right kidney. Visualized visceral structures otherwise unremarkable.  Disc levels:  T11-12: Seen only on sagittal projection. Mild diffuse disc bulge with disc desiccation. Mild facet hypertrophy. No stenosis.  T12-L1:  Unremarkable.  L1-2: Mild diffuse disc bulge. Mild bilateral facet hypertrophy. No significant canal or foraminal stenosis.  L2-3: Negative interspace. Mild bilateral facet hypertrophy. No significant stenosis.  L3-4: Trace anterolisthesis. Mild diffuse disc bulge with disc desiccation and intervertebral disc space narrowing. Moderate facet and ligament flavum hypertrophy. No significant spinal stenosis. Mild right L3 foraminal narrowing. No significant left foraminal encroachment.  L4-5: 7 mm anterolisthesis of L4 on L5. Associated broad posterior pseudo disc bulge/uncovering. Severe bilateral facet arthrosis with ligamentum flavum thickening. Note made of a superimposed 6 mm synovial cyst at the medial aspect of the right L4-5 facet (series 8, image 21). Resultant severe canal with bilateral lateral recess stenosis. Moderate bilateral L4 foraminal narrowing, right worse than left.  L5-S1: Retrolisthesis. Diffuse disc bulge with disc desiccation and intervertebral disc space narrowing. Reactive endplate changes. Mild to moderate facet and ligament flavum hypertrophy, left slightly worse than right. Trace joint effusion on the right. No significant canal or lateral recess stenosis. Mild to moderate bilateral L5 foraminal narrowing.  IMPRESSION: 1. 7 mm anterolisthesis of L4 on L5 with associated disc bulge and advanced facet arthrosis, resulting in severe canal with moderate bilateral L4 foraminal stenosis. 2. Disc bulging with reactive endplate changes and facet hypertrophy at L5-S1 with resultant mild to moderate bilateral L5 foraminal stenosis. 3. Mild right L3 foraminal narrowing related to disc bulge and facet hypertrophy.   Electronically Signed   By: Rise Mu M.D.   On: 05/28/2018 18:14  I reviewed patient's chart.  After that study was reviewed by the PCP he started Cymbalta and ordered formal physical therapy.  Patient also went on to have a  lumbar ESI October 22, 2018.  She states that she has not had any improvement of her symptoms and this is actually worsened since previous study.  Patient states that she was never referred for surgical evaluation.  She has also taken over-the-counter Aleve and aspirin without any relief.  Low back pain radiates into the bilateral hips and buttocks with radiation down to both feet.  She also has numbness and tingling in the legs as well.  Symptoms also worse when she is ambulating.  Walking distances have decreased over the last year or so.  In the grocery store she does have to hold onto a cart leaning forward to assist and try to relieve some of her low back and leg symptoms.   Review of Systems No current cardiac pulmonary GI GU issues  Objective: Vital Signs: BP (!) 171/103   Pulse (!) 101   Ht 5\' 6"  (1.676 m)   Wt 265 lb (120.2 kg)   BMI 42.77 kg/m   Physical Exam HENT:     Head: Normocephalic.  Eyes:     Extraocular Movements: Extraocular movements intact.     Pupils: Pupils are equal, round, and reactive to light.  Pulmonary:     Effort: No respiratory distress.  Musculoskeletal:     Comments: Gait is antalgic.  Positive lumbar paraspinal tenderness.  Positive bilateral sciatic notch tenderness.  Negative logroll.  Positive bilateral straight leg raise.  No focal motor deficits.  Patient does have bilateral lower extremity edema.   Neurological:  Mental Status: She is alert and oriented to person, place, and time.     Ortho Exam  Specialty Comments:  No specialty comments available.  Imaging: No results found.   PMFS History: There are no problems to display for this patient.  Past Medical History:  Diagnosis Date  . Anxiety   . Depression   . GERD (gastroesophageal reflux disease)   . Hyperlipidemia   . Hypertension     History reviewed. No pertinent family history.  History reviewed. No pertinent surgical history. Social History   Occupational History  .  Not on file  Tobacco Use  . Smoking status: Current Every Day Smoker    Packs/day: 1.00    Years: 30.00    Pack years: 30.00    Types: Cigarettes  . Smokeless tobacco: Never Used  Substance and Sexual Activity  . Alcohol use: Not on file  . Drug use: Not on file  . Sexual activity: Not on file

## 2020-05-05 ENCOUNTER — Other Ambulatory Visit: Payer: Self-pay

## 2020-05-05 ENCOUNTER — Ambulatory Visit
Admission: RE | Admit: 2020-05-05 | Discharge: 2020-05-05 | Disposition: A | Discharge status: Home / Self Care | Payer: Medicare Other | Source: Ambulatory Visit | Attending: Surgery | Admitting: Surgery

## 2020-05-05 DIAGNOSIS — M4316 Spondylolisthesis, lumbar region: Secondary | ICD-10-CM | POA: Diagnosis present

## 2020-05-24 ENCOUNTER — Other Ambulatory Visit: Payer: Self-pay

## 2020-05-24 ENCOUNTER — Ambulatory Visit (INDEPENDENT_AMBULATORY_CARE_PROVIDER_SITE_OTHER): Payer: Medicare Other | Admitting: Specialist

## 2020-05-24 ENCOUNTER — Encounter: Payer: Self-pay | Admitting: Specialist

## 2020-05-24 VITALS — BP 173/102 | HR 116 | Ht 66.0 in | Wt 265.0 lb

## 2020-05-24 DIAGNOSIS — M4316 Spondylolisthesis, lumbar region: Secondary | ICD-10-CM

## 2020-05-24 DIAGNOSIS — I10 Essential (primary) hypertension: Secondary | ICD-10-CM | POA: Diagnosis not present

## 2020-05-24 DIAGNOSIS — Z6841 Body Mass Index (BMI) 40.0 and over, adult: Secondary | ICD-10-CM | POA: Diagnosis not present

## 2020-05-24 DIAGNOSIS — M48062 Spinal stenosis, lumbar region with neurogenic claudication: Secondary | ICD-10-CM

## 2020-05-24 MED ORDER — TRAMADOL HCL 50 MG PO TABS: 50.0000 mg | ORAL_TABLET | Freq: Four times a day (QID) | ORAL | 0 refills | Status: DC | PRN

## 2020-05-24 NOTE — Patient Instructions (Signed)
Avoid bending, stooping and avoid lifting weights greater than 10 lbs. Avoid prolong standing and walking. Avoid frequent bending and stooping  No lifting greater than 10 lbs. May use ice or moist heat for pain. Weight loss is of benefit. Handicap license is approved. Avoid bending, stooping and avoid lifting weights greater than 10 lbs. Avoid prolong standing and walking. Order for a new walker with wheels. Surgery recommended is a two level lumbar fusion L4-5 and L3-4 this would be done with rods, screws and cages with local bone graft and allograft (donor bone graft). Take tramadol for for pain. Risk of surgery includes risk of infection 1 in 300 patients, bleeding 1/2% chance you would need a transfusion.   Risk to the nerves is one in 10,000. You will need to use a brace for 3 months and wean from the brace on the 4th month. Expect improved walking and standing tolerance. Expect relief of leg pain but numbness may persist depending on the length and degree of pressure that has been present. Exercise while waiting and losing weight use of an exercise bike or walking in a swimming pool.

## 2020-05-24 NOTE — Progress Notes (Signed)
Office Visit Note   Patient: Heather Hartman           Date of Birth: 05/22/1950           MRN: [id number] Visit Date: 05/24/2020              Requested by: Medicine, [facility] [address] [place],  [place] [id number] PCP: Medicine, [facility]   Assessment & Plan: Visit Diagnoses:  1. Spondylolisthesis, lumbar region   2. Neurogenic claudication due to lumbar spinal stenosis   3. Spondylolisthesis of lumbar region   4. Spinal stenosis of lumbar region with neurogenic claudication   5. Body mass index 40.0-44.9, adult ([place])   6. Primary hypertension     Plan: Avoid bending, stooping and avoid lifting weights greater than 10 lbs. Avoid prolong standing and walking. Avoid frequent bending and stooping  No lifting greater than 10 lbs. May use ice or moist heat for pain. Weight loss is of benefit. Handicap license is approved. Avoid bending, stooping and avoid lifting weights greater than 10 lbs. Avoid prolong standing and walking. Order for a new walker with wheels. Surgery recommended is a two level lumbar fusion L4-5 and L3-4 this would be done with rods, screws and cages with local bone graft and allograft (donor bone graft). Take tramadol for for pain. Risk of surgery includes risk of infection 1 in 300 patients, bleeding 1/2% chance you would need a transfusion.   Risk to the nerves is one in 10,000. You will need to use a brace for 3 months and wean from the brace on the 4th month. Expect improved walking and standing tolerance. Expect relief of leg pain but numbness may persist depending on the length and degree of pressure that has been present. Exercise while waiting and losing weight use of an exercise bike or walking in a swimming pool.   Follow-Up Instructions: No follow-ups on file.   Orders:  No orders of the defined types were placed in this encounter.  Meds ordered this encounter  Medications  . traMADol (ULTRAM) 50 MG tablet    Sig:  Take 1 tablet (50 mg total) by mouth every 6 (six) hours as needed.    Dispense:  30 tablet    Refill:  0      Procedures: No procedures performed   Clinical Data: No additional findings.   Subjective: Chief Complaint  Patient presents with  . Lower Back - Follow-up    NP --MRI review lumbar    70 year old with over 3 year history of buttock pain and pain with standing and walking. She has seen several MDs in [place] and is taking tylenol.  Pain is into the buttocks and greater with standing and walking. She has pain with standing and has to lean on the grocery cart to relieve the pain. She has trouble even walking  10-15 feet. No bowel or bladder difficulty. Saw Benjiman Core, PA-C and an MRI was scheduled.   Review of Systems  Constitutional: Negative.   HENT: Negative.   Eyes: Negative.   Respiratory: Negative.   Cardiovascular: Negative.   Gastrointestinal: Negative.   Endocrine: Negative.   Genitourinary: Negative.   Musculoskeletal: Negative.   Skin: Negative.   Allergic/Immunologic: Negative.   Neurological: Negative.   Hematological: Negative.   Psychiatric/Behavioral: Negative.      Objective: Vital Signs: BP (!) 173/102 (BP Location: Left Arm, Patient Position: Sitting)   Pulse (!) 116   Ht 5'  6" (1.676 m)   Wt 265 lb (120.2 kg)   BMI 42.77 kg/m   Physical Exam Constitutional:      Appearance: She is well-developed and well-nourished.  HENT:     Head: Normocephalic and atraumatic.  Eyes:     Extraocular Movements: EOM normal.     Pupils: Pupils are equal, round, and reactive to light.  Pulmonary:     Effort: Pulmonary effort is normal.     Breath sounds: Normal breath sounds.  Abdominal:     General: Bowel sounds are normal.     Palpations: Abdomen is soft.  Musculoskeletal:     Cervical back: Normal range of motion and neck supple.     Lumbar back: Negative right straight leg raise test and negative left straight leg  raise test.  Skin:    General: Skin is warm and dry.  Neurological:     Mental Status: She is alert and oriented to person, place, and time.  Psychiatric:        Mood and Affect: Mood and affect normal.        Behavior: Behavior normal.        Thought Content: Thought content normal.        Judgment: Judgment normal.     Back Exam   Tenderness  The patient is experiencing tenderness in the lumbar.  Range of Motion  Extension: abnormal  Flexion: abnormal  Lateral bend right: abnormal  Lateral bend left: abnormal  Rotation right: abnormal  Rotation left: abnormal   Muscle Strength  Right Quadriceps:  5/5  Left Quadriceps:  5/5  Right Hamstrings:  5/5  Left Hamstrings:  5/5   Tests  Straight leg raise right: negative Straight leg raise left: negative  Reflexes  Patellar: 2/4 Achilles: 2/4  Other  Toe walk: normal Heel walk: normal Gait: abnormal  Erythema: no back redness Scars: absent      Specialty Comments:  No specialty comments available.  Imaging: No results found.   PMFS History: There are no problems to display for this patient.  Past Medical History:  Diagnosis Date  . Anxiety   . Depression   . GERD (gastroesophageal reflux disease)   . Hyperlipidemia   . Hypertension     History reviewed. No pertinent family history.  History reviewed. No pertinent surgical history. Social History   Occupational History  . Not on file  Tobacco Use  . Smoking status: Current Every Day Smoker    Packs/day: 1.00    Years: 30.00    Pack years: 30.00    Types: Cigarettes  . Smokeless tobacco: Never Used  Substance and Sexual Activity  . Alcohol use: Not on file  . Drug use: Not on file  . Sexual activity: Not on file

## 2020-06-21 ENCOUNTER — Ambulatory Visit: Payer: Medicare Other | Admitting: Specialist

## 2020-07-12 ENCOUNTER — Encounter (INDEPENDENT_AMBULATORY_CARE_PROVIDER_SITE_OTHER): Payer: Self-pay | Admitting: Nurse Practitioner

## 2020-07-12 ENCOUNTER — Ambulatory Visit (INDEPENDENT_AMBULATORY_CARE_PROVIDER_SITE_OTHER): Payer: Medicare Other | Admitting: Nurse Practitioner

## 2020-07-12 ENCOUNTER — Other Ambulatory Visit: Payer: Self-pay

## 2020-07-12 VITALS — BP 123/73 | HR 96 | Ht 66.0 in | Wt 246.0 lb

## 2020-07-12 DIAGNOSIS — I10 Essential (primary) hypertension: Secondary | ICD-10-CM | POA: Diagnosis not present

## 2020-07-12 DIAGNOSIS — F17200 Nicotine dependence, unspecified, uncomplicated: Secondary | ICD-10-CM

## 2020-07-12 DIAGNOSIS — I890 Lymphedema, not elsewhere classified: Secondary | ICD-10-CM

## 2020-07-12 DIAGNOSIS — R61 Generalized hyperhidrosis: Secondary | ICD-10-CM | POA: Insufficient documentation

## 2020-07-12 DIAGNOSIS — F419 Anxiety disorder, unspecified: Secondary | ICD-10-CM | POA: Insufficient documentation

## 2020-07-12 DIAGNOSIS — E7800 Pure hypercholesterolemia, unspecified: Secondary | ICD-10-CM | POA: Insufficient documentation

## 2020-07-22 ENCOUNTER — Encounter (INDEPENDENT_AMBULATORY_CARE_PROVIDER_SITE_OTHER): Payer: Self-pay | Admitting: Nurse Practitioner

## 2020-07-22 NOTE — Progress Notes (Signed)
Subjective:    Patient ID: Heather Hartman, female    DOB: 03-Oct-1950, 70 y.o.   MRN: [id number] Chief Complaint  Patient presents with   New Patient (Initial Visit)    Lymphedema     Patient is seen for evaluation of leg swelling. The patient first noticed the swelling remotely but is now concerned because of a significant increase in the overall edema. The swelling is associated with pain and discoloration. The patient notes that in the morning the legs are significantly improved but they steadily worsened throughout the course of the day. Elevation makes the legs better, dependency makes them much worse.   There is no history of ulcerations associated with the swelling.   The patient denies any recent changes in their medications.  The patient has not been wearing graduated compression.  The patient has no had any past angiography, interventions or vascular surgery.  The patient denies a history of DVT or PE. There is no prior history of phlebitis. There is no history of primary lymphedema.  There is no history of radiation treatment to the groin or pelvis No history of malignancies. No history of trauma or groin or pelvic surgery. No history of foreign travel or parasitic infections area    Review of Systems  Cardiovascular: Positive for leg swelling.  All other systems reviewed and are negative.      Objective:   Physical Exam Vitals reviewed.  HENT:     Head: Normocephalic.  Cardiovascular:     Rate and Rhythm: Normal rate.     Pulses: Normal pulses.  Pulmonary:     Effort: Pulmonary effort is normal.  Musculoskeletal:     Left lower leg: 2+ Edema present.  Neurological:     Mental Status: She is alert and oriented to person, place, and time.  Psychiatric:        Mood and Affect: Mood normal.        Behavior: Behavior normal.        Thought Content: Thought content normal.        Judgment: Judgment normal.     BP 123/73    Pulse 96    Ht 5\' 6"  (1.676  m)    Wt 246 lb (111.6 kg)    BMI 39.71 kg/m   Past Medical History:  Diagnosis Date   Anxiety    Depression    GERD (gastroesophageal reflux disease)    Hyperlipidemia    Hypertension     Social History   Socioeconomic History   Marital status: Single    Spouse name: Not on file   Number of children: Not on file   Years of education: Not on file   Highest education level: Not on file  Occupational History   Not on file  Tobacco Use   Smoking status: Current Every Day Smoker    Packs/day: 1.00    Years: 30.00    Pack years: 30.00    Types: Cigarettes   Smokeless tobacco: Never Used  Substance and Sexual Activity   Alcohol use: Not on file   Drug use: Not on file   Sexual activity: Not on file  Other Topics Concern   Not on file  Social History Narrative   Not on file   Social Determinants of Health   Financial Resource Strain: Not on file  Food Insecurity: Not on file  Transportation Needs: Not on file  Physical Activity: Not on file  Stress: Not on file  Social Connections:  Not on file  Intimate Partner Violence: Not on file    History reviewed. No pertinent surgical history.  History reviewed. No pertinent family history.  Allergies  Allergen Reactions   Vioxx [Rofecoxib] Other (See Comments)    Uncoded Allergy. Allergen: viox   Atorvastatin Other (See Comments)    Intolerance - muscle cramps    No flowsheet data found.    CMP  No results found for: NA, K, CL, CO2, GLUCOSE, BUN, CREATININE, CALCIUM, PROT, ALBUMIN, AST, ALT, ALKPHOS, BILITOT, GFRNONAA, GFRAA   No results found.     Assessment & Plan:   1. Lymphedema I have had a [place] discussion with the patient regarding swelling and why it  causes symptoms.  Patient will begin wearing graduated compression stockings class 1 (20-30 mmHg) on a daily basis a prescription was given. The patient will  beginning wearing the stockings first thing in the morning and removing them  in the evening. The patient is instructed specifically not to sleep in the stockings.   In addition, behavioral modification will be initiated.  This will include frequent elevation, use of over the counter pain medications and exercise such as walking.  I have reviewed systemic causes for chronic edema such as liver, kidney and cardiac etiologies.  The patient denies problems with these organ systems.    Consideration for a lymph pump will also be made based upon the effectiveness of conservative therapy.  This would help to improve the edema control and prevent sequela such as ulcers and infections   Patient should undergo duplex ultrasound of the venous system to ensure that DVT or reflux is not present.  The patient will follow-up with me after the ultrasound.   - VAS [place] LOWER EXTREMITY VENOUS REFLUX; Future  2. Essential hypertension Continue antihypertensive medications as already ordered, these medications have been reviewed and there are no changes at this time.   3. Tobacco dependence Smoking cessation was discussed, 3-10 minutes spent on this topic specifically   4. Obesity, morbid ([place]) This is a contributing factor with lymphedema   Current Outpatient Medications on File Prior to Visit  Medication Sig Dispense Refill   acetaminophen (TYLENOL) 500 MG tablet Take 500 mg by mouth every 6 (six) hours as needed.     furosemide (LASIX) 20 MG tablet Take 20 mg by mouth daily.     lisinopril-hydrochlorothiazide (ZESTORETIC) 10-12.5 MG tablet Take 1 tablet by mouth daily.     Potassium Chloride ER 20 MEQ TBCR Take 1 tablet by mouth daily.     traMADol (ULTRAM) 50 MG tablet Take 1 tablet (50 mg total) by mouth every 6 (six) hours as needed. 30 tablet 0   ezetimibe (ZETIA) 10 MG tablet Take by mouth. (Patient not taking: Reported on 07/12/2020)     No current facility-administered medications on file prior to visit.    There are no Patient Instructions on file for this  visit. No follow-ups on file.   Kris Hartmann, NP

## 2020-07-25 ENCOUNTER — Ambulatory Visit (INDEPENDENT_AMBULATORY_CARE_PROVIDER_SITE_OTHER): Payer: Medicare Other | Admitting: Nurse Practitioner

## 2020-07-25 ENCOUNTER — Encounter (INDEPENDENT_AMBULATORY_CARE_PROVIDER_SITE_OTHER): Payer: Medicare Other

## 2020-10-27 ENCOUNTER — Other Ambulatory Visit: Payer: Self-pay

## 2020-10-27 ENCOUNTER — Emergency Department
Admission: EM | Admit: 2020-10-27 | Discharge: 2020-10-27 | Disposition: A | Discharge status: Home / Self Care | Payer: Medicare Other | Attending: Emergency Medicine | Admitting: Emergency Medicine

## 2020-10-27 DIAGNOSIS — F17210 Nicotine dependence, cigarettes, uncomplicated: Secondary | ICD-10-CM | POA: Insufficient documentation

## 2020-10-27 DIAGNOSIS — I10 Essential (primary) hypertension: Secondary | ICD-10-CM | POA: Diagnosis not present

## 2020-10-27 DIAGNOSIS — Z79899 Other long term (current) drug therapy: Secondary | ICD-10-CM | POA: Diagnosis not present

## 2020-10-27 DIAGNOSIS — J029 Acute pharyngitis, unspecified: Secondary | ICD-10-CM | POA: Diagnosis not present

## 2020-10-27 MED ORDER — MAGIC MOUTHWASH W/LIDOCAINE: 5.0000 mL | Freq: Four times a day (QID) | ORAL | 0 refills | Status: DC

## 2020-10-27 NOTE — ED Triage Notes (Signed)
Pt to ED for sore throat for a few days, states was seen by [facility] yesterday, tested neg for strep and told to take tylenol.  States now she feels swollen. No facial swelling noted or swelling to mouth Last tylenol intake this AM

## 2020-10-27 NOTE — ED Provider Notes (Signed)
[facility] Emergency Department Provider Note  ____________________________________________  Time seen: Approximately 12:37 PM  I have reviewed the triage vital signs and the nursing notes.   HISTORY  Chief Complaint Sore Throat    HPI Heather Hartman is a 70 y.o. female who presents the emergency department complaining of sore throat.  She has had a sore throat x3 days.  She states that she was evaluated in urgent care yesterday and tested negative for strep.  She was informed to take Tylenol at home.  She states this morning she woke up with worse sore throat/feeling like something was in the back of her throat.  She states that looking at the back of her throat there is something "dangling back there."  Patient states that she is having no difficulty swallowing or breathing, it is more an aggravation than anything.       Past Medical History:  Diagnosis Date   Anxiety    Depression    GERD (gastroesophageal reflux disease)    Hyperlipidemia    Hypertension     Patient Active Problem List   Diagnosis Date Noted   Anxiety and depression 07/12/2020   Essential hypertension 07/12/2020   Hyperhidrosis 07/12/2020   Pure hypercholesterolemia 07/12/2020   DNR (do not resuscitate) 12/09/2019   Pain, generalized 08/30/2019   Encounter for general adult medical examination without abnormal findings 03/31/2019   Lumbar radiculopathy, chronic 05/13/2018   Obesity, morbid ([place]) 05/13/2018   Tobacco dependence 05/13/2018   GERD without esophagitis 01/01/2014   Uterine fibroid 08/08/2012    No past surgical history on file.  Prior to Admission medications   Medication Sig Start Date End Date Taking? Authorizing Provider  magic mouthwash w/lidocaine SOLN Take 5 mLs by mouth 4 (four) times daily. 10/27/20  Yes Tuvia Woodrick, Charline Bills, PA-C  acetaminophen (TYLENOL) 500 MG tablet Take 500 mg by mouth every 6 (six) hours as needed.    [provider]   ezetimibe (ZETIA) 10 MG tablet Take by mouth. Patient not taking: Reported on 07/12/2020 08/30/19 08/29/20  [provider]  furosemide (LASIX) 20 MG tablet Take 20 mg by mouth daily. 05/04/20   [provider]  lisinopril-hydrochlorothiazide (ZESTORETIC) 10-12.5 MG tablet Take 1 tablet by mouth daily. 03/05/20   [provider]  Potassium Chloride ER 20 MEQ TBCR Take 1 tablet by mouth daily. 06/25/20   [provider]  traMADol (ULTRAM) 50 MG tablet Take 1 tablet (50 mg total) by mouth every 6 (six) hours as needed. 05/24/20   Jessy Oto, MD    Allergies Vioxx [rofecoxib] and Atorvastatin  No family history on file.  Social History Social History   Tobacco Use   Smoking status: Every Day    Packs/day: 1.00    Years: 30.00    Pack years: 30.00    Types: Cigarettes   Smokeless tobacco: Never     Review of Systems  Constitutional: No fever/chills Eyes: No visual changes. No discharge ENT: Positive for sore throat Cardiovascular: no chest pain. Respiratory: no cough. No SOB. Gastrointestinal: No abdominal pain.  No nausea, no vomiting.  No diarrhea.  No constipation. Musculoskeletal: Negative for musculoskeletal pain. Skin: Negative for rash, abrasions, lacerations, ecchymosis. Neurological: Negative for headaches, focal weakness or numbness.  10 System ROS otherwise negative.  ____________________________________________   PHYSICAL EXAM:  VITAL SIGNS: ED Triage Vitals  Enc Vitals Group     BP 10/27/20 1140 (!) 178/116     Pulse Rate 10/27/20 1140 (!)  113     Resp 10/27/20 1140 20     Temp 10/27/20 1140 98.5 F (36.9 C)     Temp Source 10/27/20 1140 Oral     SpO2 10/27/20 1140 98 %     Weight 10/27/20 1142 245 lb (111.1 kg)     Height 10/27/20 1142 5\' 6"  (1.676 m)     Head Circumference --      Peak Flow --      Pain Score 10/27/20 1141 8     Pain Loc --      Pain Edu? --      Excl. in [place]? --      Constitutional: Alert and  oriented. Well appearing and in no acute distress. Eyes: Conjunctivae are normal. PERRL. EOMI. Head: Atraumatic. ENT:      Ears:       Nose: No congestion/rhinnorhea.      Mouth/Throat: Mucous membranes are moist.  Visualization of the oropharynx reveals erythema and edema with no unilateral hypertrophy.  No uvular deviation.  No exudates.  Mallampati score of 4 and uvula is touching the back of the patient's tongue. Neck: No stridor.  Neck is supple full range of motion Hematological/Lymphatic/Immunilogical: No cervical lymphadenopathy. Cardiovascular: Normal rate, regular rhythm. Normal S1 and S2.  Good peripheral circulation. Respiratory: Normal respiratory effort without tachypnea or retractions. Lungs CTAB. Good air entry to the bases with no decreased or absent breath sounds. Musculoskeletal: Full range of motion to all extremities. No gross deformities appreciated. Neurologic:  Normal speech and language. No gross focal neurologic deficits are appreciated.  Skin:  Skin is warm, dry and intact. No rash noted. Psychiatric: Mood and affect are normal. Speech and behavior are normal. Patient exhibits appropriate insight and judgement.   ____________________________________________   LABS (all labs ordered are listed, but only abnormal results are displayed)  Labs Reviewed - No data to display ____________________________________________  EKG   ____________________________________________  RADIOLOGY   No results found.  ____________________________________________    PROCEDURES  Procedure(s) performed:    Procedures    Medications - No data to display   ____________________________________________   INITIAL IMPRESSION / ASSESSMENT AND PLAN / ED COURSE  Pertinent labs & imaging results that were available during my care of the patient were reviewed by me and considered in my medical decision making (see chart for details).  Review of the [place] CSRS was  performed in accordance of the [place] prior to dispensing any controlled drugs.           Patient's diagnosis is consistent with viral pharyngitis.  Patient presents the emergency department complaining of sore throat.  She states that there is a piece of "meat tingling in the back of my throat."  That is aggravating her.  Visualization reveals that patient uvula is touching the posterior tongue.  Feel that this is likely patient's symptoms.  There is no evidence of peritonsillar/retropharyngeal abscess.  No indication for labs or imaging.  Patient already had a negative strep swab and visualization does not appear strep like.  Patient will have symptom control medications with Magic mouthwash.  Patient is given ED precautions to return to the ED for any worsening or new symptoms.     ____________________________________________  FINAL CLINICAL IMPRESSION(S) / ED DIAGNOSES  Final diagnoses:  Viral pharyngitis      NEW MEDICATIONS STARTED DURING THIS VISIT:  ED Discharge Orders          Ordered    magic mouthwash w/lidocaine SOLN  4 times daily       Note to Pharmacy: Dispense in a 1/1/1 ratio. Use lidocaine, diphenhydramine, prednisolone   10/27/20 1311                This chart was dictated using voice recognition software/Dragon. Despite best efforts to proofread, errors can occur which can change the meaning. Any change was purely unintentional.    Darletta Moll, PA-C 10/27/20 1312    Blake Divine, MD 10/31/20 [phone]

## 2020-12-27 ENCOUNTER — Other Ambulatory Visit: Payer: Self-pay | Admitting: Internal Medicine

## 2020-12-27 DIAGNOSIS — J36 Peritonsillar abscess: Secondary | ICD-10-CM

## 2021-01-16 ENCOUNTER — Ambulatory Visit
Admission: RE | Admit: 2021-01-16 | Discharge: 2021-01-16 | Disposition: A | Discharge status: Home / Self Care | Payer: Medicare Other | Source: Ambulatory Visit | Attending: Internal Medicine | Admitting: Internal Medicine

## 2021-01-16 ENCOUNTER — Other Ambulatory Visit: Payer: Self-pay

## 2021-01-16 DIAGNOSIS — J36 Peritonsillar abscess: Secondary | ICD-10-CM | POA: Diagnosis not present

## 2022-01-14 ENCOUNTER — Encounter: Payer: Self-pay | Admitting: Student in an Organized Health Care Education/Training Program

## 2022-01-14 ENCOUNTER — Ambulatory Visit
Payer: Medicare Other | Attending: Student in an Organized Health Care Education/Training Program | Admitting: Student in an Organized Health Care Education/Training Program

## 2022-01-14 VITALS — BP 165/110 | HR 104 | Temp 97.1°F | Ht 66.0 in | Wt 264.1 lb

## 2022-01-14 DIAGNOSIS — G894 Chronic pain syndrome: Secondary | ICD-10-CM | POA: Insufficient documentation

## 2022-01-14 DIAGNOSIS — M5416 Radiculopathy, lumbar region: Secondary | ICD-10-CM | POA: Insufficient documentation

## 2022-01-14 DIAGNOSIS — M48062 Spinal stenosis, lumbar region with neurogenic claudication: Secondary | ICD-10-CM | POA: Diagnosis present

## 2022-01-14 DIAGNOSIS — M533 Sacrococcygeal disorders, not elsewhere classified: Secondary | ICD-10-CM | POA: Insufficient documentation

## 2022-01-14 DIAGNOSIS — G8929 Other chronic pain: Secondary | ICD-10-CM | POA: Insufficient documentation

## 2022-01-14 DIAGNOSIS — M5136 Other intervertebral disc degeneration, lumbar region: Secondary | ICD-10-CM | POA: Insufficient documentation

## 2022-01-14 DIAGNOSIS — M47816 Spondylosis without myelopathy or radiculopathy, lumbar region: Secondary | ICD-10-CM | POA: Insufficient documentation

## 2022-01-14 MED ORDER — PREGABALIN 50 MG PO CAPS: ORAL_CAPSULE | ORAL | 0 refills | Status: DC

## 2022-01-14 NOTE — Progress Notes (Signed)
Safety precautions to be maintained throughout the outpatient stay will include: orient to surroundings, keep bed in low position, maintain call bell within reach at all times, provide assistance with transfer out of bed and ambulation.  

## 2022-01-14 NOTE — Progress Notes (Signed)
Patient: Heather Hartman  Service Category: E/M  Provider: Gillis Santa, MD  DOB: 1951/01/28  DOS: 01/14/2022  Referring Provider: Gladstone Lighter, MD  MRN: [id number]  Setting: Ambulatory outpatient  PCP: Gladstone Lighter, MD  Type: New Patient  Specialty: Interventional Pain Management    Location: Office  Delivery: Face-to-face     Primary Reason(s) for Visit: Encounter for initial evaluation of one or more chronic problems (new to examiner) potentially causing chronic pain, and posing a threat to normal musculoskeletal function. (Level of risk: High) CC: Pain (Below buttocks, bilat) and Foot Pain (bilat)  HPI  Heather Hartman is a 71 y.o. year old, female patient, who comes for the first time to our practice referred by Gladstone Lighter, MD for our initial evaluation of her chronic pain. She has Anxiety and depression; DNR (do not resuscitate); Encounter for general adult medical examination without abnormal findings; Essential hypertension; GERD without esophagitis; Hyperhidrosis; Chronic radicular lumbar pain; Obesity, morbid ([place]); Pain, generalized; Pure hypercholesterolemia; Tobacco dependence; Uterine fibroid; Spinal stenosis, lumbar region, with neurogenic claudication; Lumbar spondylosis; Lumbar degenerative disc disease; Sacroiliac joint pain; and Chronic pain syndrome on their problem list. Today she comes in for evaluation of her Pain (Below buttocks, bilat) and Foot Pain (bilat)  Pain Assessment: Location: Right, Left Buttocks Radiating: down legs to bottom of feet bilat Onset: More than a month ago Duration: Chronic pain Quality: Numbness, Shooting, Sharp, Pins and needles, Tingling, Other (Comment) ("feels like rocks in my feet"; "feels like I'm walking on rocks") Severity: 8 /10 (subjective, self-reported pain score)  Effect on ADL: limits daily activities, "I can't do anything" Timing: Constant Modifying factors: laying down BP: (!) 165/110  HR: (!) 104  Onset and Duration:  Gradual and Present longer than 3 months Cause of pain: Unknown Severity: No change since onset Timing: Morning Aggravating Factors: Prolonged standing and Walking Alleviating Factors: Lying down Associated Problems: Day-time cramps, Night-time cramps, Depression, Numbness, Sadness, Sweating, Swelling, and Tingling Quality of Pain: Aching, Sharp, and Tingling Previous Examinations or Tests: MRI scan Previous Treatments: The patient denies none listed  Heather Hartman is a pleasant 83 female who is being referred from her primary care provider, Dr. Raliegh Ip with a chief complaint of low back and bilateral leg pain as well as associated weakness.  This is secondary to severe lumbar spinal stenosis and neurogenic claudication at L4-L5.  Patient also has severe lumbar facet arthropathy at L3-L4, L4-L5.  She states that she has difficulty standing up for extended period of time or walking.  She states that bending forward does help.  When she goes to the grocery store, she rides in a motorized wheelchair.  She has difficulty performing ADLs.  She is morbidly obese.  She also has chronic lymphedema bilaterally, right greater than left.  She believes this started after a cellulitis flare.  She has compression pumps which she does not use.  She has tried gabapentin and duloxetine in the past with limited response.  She has not tried Lyrica.  I reviewed her lumbar MRI with her which is detailed below.  She denies any bowel or bladder dysfunction.  Depression as well that is being managed by her primary care provider.  They have discussed adding an SSRI but the patient wants to hold off.   Historic Controlled Substance Pharmacotherapy Review  PMP and historical list of controlled substances:   12/18/2021  12/18/2021   1  Tramadol Hcl 50 Mg Tablet 30.00  15  [place]  [id number]  [facility] ([id number])  0/0  20.00 MME  Medicare  [place]     The patient  has no history on file for drug use. List of prior UDS Testing: No results found for:  "MDMA", "COCAINSCRNUR", "PCPSCRNUR", "PCPQUANT", "CANNABQUANT", "THCU", "ETH", "CBDTHCR", "D8THCCBX", "D9THCCBX" Historical Background Evaluation: [place] PMP: PDMP not reviewed this encounter. Review of the past 16-month conducted.               Department of public safety, offender search: (Editor, commissioningInformation) Non-contributory Risk Assessment Profile: Aberrant behavior: None observed or detected today Risk factors for fatal opioid overdose: None identified today Fatal overdose hazard ratio (HR): Calculation deferred Non-fatal overdose hazard ratio (HR): Calculation deferred Risk of opioid abuse or dependence: 0.7-3.0% with doses ? 36 MME/day and 6.1-26% with doses ? 120 MME/day. Substance use disorder (SUD) risk level: See below Personal History of Substance Abuse (SUD-Substance use disorder):  Alcohol: Negative  Illegal Drugs: Negative  Rx Drugs: Negative  ORT Risk Level calculation: Low Risk  Opioid Risk Tool - 01/14/22 0929       Family History of Substance Abuse   Alcohol Negative    Illegal Drugs Negative    Rx Drugs Negative      Personal History of Substance Abuse   Alcohol Negative    Illegal Drugs Negative    Rx Drugs Negative      Age   Age between 127-45years  No      Psychological Disease   Psychological Disease Negative    Depression Negative      Total Score   Opioid Risk Tool Scoring 0    Opioid Risk Interpretation Low Risk            ORT Scoring interpretation table:  Score <3 = Low Risk for SUD  Score between 4-7 = Moderate Risk for SUD  Score >8 = High Risk for Opioid Abuse   PHQ-2 Depression Scale:  Total score:    PHQ-2 Scoring interpretation table: (Score and probability of major depressive disorder)  Score 0 = No depression  Score 1 = 15.4% Probability  Score 2 = 21.1% Probability  Score 3 = 38.4% Probability  Score 4 = 45.5% Probability  Score 5 = 56.4% Probability  Score 6 = 78.6% Probability   PHQ-9 Depression Scale:  Total score:     PHQ-9 Scoring interpretation table:  Score 0-4 = No depression  Score 5-9 = Mild depression  Score 10-14 = Moderate depression  Score 15-19 = Moderately severe depression  Score 20-27 = Severe depression (2.4 times higher risk of SUD and 2.89 times higher risk of overuse)   Pharmacologic Plan:  Continue with PCP- we are focusing on interventional pain management             Initial impression: Pending review of available data and ordered tests.  Meds   Current Outpatient Medications:    acetaminophen (TYLENOL) 500 MG tablet, Take 500 mg by mouth every 6 (six) hours as needed., Disp: , Rfl:    furosemide (LASIX) 20 MG tablet, Take 20 mg by mouth daily., Disp: , Rfl:    lisinopril-hydrochlorothiazide (ZESTORETIC) 10-12.5 MG tablet, Take 1 tablet by mouth daily., Disp: , Rfl:    magic mouthwash w/lidocaine SOLN, Take 5 mLs by mouth 4 (four) times daily., Disp: 240 mL, Rfl: 0   Potassium Chloride ER 20 MEQ TBCR, Take 1 tablet by mouth daily., Disp: , Rfl:    pregabalin (LYRICA) 50 MG capsule, Take 1 capsule (50 mg  total) by mouth at bedtime for 15 days, THEN 1 capsule (50 mg total) 2 (two) times daily., Disp: 75 capsule, Rfl: 0   traMADol (ULTRAM) 50 MG tablet, Take 1 tablet (50 mg total) by mouth every 6 (six) hours as needed., Disp: 30 tablet, Rfl: 0  Imaging Review  Lumbosacral Imaging: Lumbar MR wo contrast: Results for orders placed during the hospital encounter of 05/05/20  MR Lumbar Spine w/o contrast  Narrative CLINICAL DATA:  Low back pain, bilateral lower extremity radiculopathy  EXAM: MRI LUMBAR SPINE WITHOUT CONTRAST  TECHNIQUE: Multiplanar, multisequence MR imaging of the lumbar spine was performed. No intravenous contrast was administered.  COMPARISON:  05/28/2018  FINDINGS: Segmentation:  Standard.  Alignment: Stable including trace anterolisthesis at L3-L4 and mild anterolisthesis at L4-L5.  Vertebrae: Stable vertebral body heights. No substantial  marrow edema. No suspicious osseous lesion.  Conus medullaris and cauda equina: Conus extends to the L1-L2 level. Conus appears normal. There is some redundancy of the cauda equina secondary to L4-L5 canal stenosis.  Paraspinal and other soft tissues: Unremarkable.  Disc levels:  L1-L2: Minimal disc bulge with superimposed minimal right paracentral disc protrusion. Mild facet arthropathy. No canal or foraminal stenosis. Appearance is similar.  L2-L3: Mild facet arthropathy. No canal or foraminal stenosis. Appearance is similar.  L3-L4: Anterolisthesis with uncovering disc bulge. Moderate right and marked left facet arthropathy. No canal or foraminal stenosis. Appearance is similar.  L4-L5: Anterolisthesis with uncovering of disc bulge. Marked facet arthropathy with ligamentum flavum infolding. Marked canal stenosis with effacement of subarticular recesses is stable to slightly improved. Moderate to marked right foraminal stenosis is similar. Decreased mild to moderate left foraminal stenosis.  L5-S1: Disc bulge. Moderate facet arthropathy. No canal stenosis. Mild to moderate foraminal stenosis. Appearance is similar.  IMPRESSION: Multilevel degenerative changes as detailed above. As before, changes are greatest at L4-L5. Marked canal stenosis is stable to slightly improved and there is decreased left foraminal stenosisat this level. Otherwise no substantial change since 2020 examination.   Electronically Signed By: Macy Mis M.D. On: 05/05/2020 17:37  LuComplexity Note: Imaging results reviewed.                         ROS  Cardiovascular: No reported cardiovascular signs or symptoms such as High blood pressure, coronary artery disease, abnormal heart rate or rhythm, heart attack, blood thinner therapy or heart weakness and/or failure Pulmonary or Respiratory: Smoking Neurological: No reported neurological signs or symptoms such as seizures, abnormal skin  sensations, urinary and/or fecal incontinence, being born with an abnormal open spine and/or a tethered spinal cord Psychological-Psychiatric: Depressed Gastrointestinal: No reported gastrointestinal signs or symptoms such as vomiting or evacuating blood, reflux, heartburn, alternating episodes of diarrhea and constipation, inflamed or scarred liver, or pancreas or irrregular and/or infrequent bowel movements Genitourinary: No reported renal or genitourinary signs or symptoms such as difficulty voiding or producing urine, peeing blood, non-functioning kidney, kidney stones, difficulty emptying the bladder, difficulty controlling the flow of urine, or chronic kidney disease Hematological: No reported hematological signs or symptoms such as prolonged bleeding, low or poor functioning platelets, bruising or bleeding easily, hereditary bleeding problems, low energy levels due to low hemoglobin or being anemic Endocrine: No reported endocrine signs or symptoms such as high or low blood sugar, rapid heart rate due to high thyroid levels, obesity or weight gain due to slow thyroid or thyroid disease Rheumatologic: No reported rheumatological signs and symptoms such as fatigue, joint  pain, tenderness, swelling, redness, heat, stiffness, decreased range of motion, with or without associated rash Musculoskeletal: Negative for myasthenia gravis, muscular dystrophy, multiple sclerosis or malignant hyperthermia Work History: Retired  Allergies  Ms. Nace is allergic to vioxx [rofecoxib] and atorvastatin.  Laboratory Chemistry Profile   Renal No results found for: "BUN", "CREATININE", "LABCREA", "BCR", "GFR", "GFRAA", "GFRNONAA", "SPECGRAV", "PHUR", "PROTEINUR"   Electrolytes No results found for: "NA", "K", "CL", "CALCIUM", "MG", "PHOS"   Hepatic No results found for: "AST", "ALT", "ALBUMIN", "ALKPHOS", "AMYLASE", "LIPASE", "AMMONIA"   ID No results found for: "LYMEIGGIGMAB", "HIV", "SARSCOV2NAA",  "STAPHAUREUS", "MRSAPCR", "HCVAB", "PREGTESTUR", "RMSFIGG", "QFVRPH1IGG", "QFVRPH2IGG"   Bone No results found for: "VD25OH", "VD125OH2TOT", "NW2956OZ3", "[id number]", "25OHVITD1", "25OHVITD2", "25OHVITD3", "TESTOFREE", "TESTOSTERONE"   Endocrine No results found for: "GLUCOSE", "GLUCOSEU", "HGBA1C", "TSH", "FREET4", "TESTOFREE", "TESTOSTERONE", "SHBG", "ESTRADIOL", "ESTRADIOLPCT", "ESTRADIOLFRE", "LABPREG", "ACTH", "CRTSLPL", "UCORFRPERLTR", "UCORFRPERDAY", "CORTISOLBASE"   Neuropathy No results found for: "VITAMINB12", "FOLATE", "HGBA1C", "HIV"   CNS No results found for: "COLORCSF", "APPEARCSF", "RBCCOUNTCSF", "WBCCSF", "POLYSCSF", "LYMPHSCSF", "EOSCSF", "PROTEINCSF", "GLUCCSF", "JCVIRUS", "CSFOLI", "IGGCSF", "LABACHR", "ACETBL"   Inflammation (CRP: Acute  ESR: Chronic) No results found for: "CRP", "ESRSEDRATE", "LATICACIDVEN"   Rheumatology No results found for: "RF", "ANA", "LABURIC", "URICUR", "LYMEIGGIGMAB", "LYMEABIGMQN", "HLAB27"   Coagulation No results found for: "INR", "LABPROT", "APTT", "PLT", "DDIMER", "LABHEMA", "VITAMINK1", "AT3"   Cardiovascular No results found for: "BNP", "CKTOTAL", "CKMB", "TROPONINI", "HGB", "HCT", "LABVMA", "EPIRU", "EPINEPH24HUR", "NOREPRU", "NOREPI24HUR", "DOPARU", "DOPAM24HRUR"   Screening No results found for: "SARSCOV2NAA", "COVIDSOURCE", "STAPHAUREUS", "MRSAPCR", "HCVAB", "HIV", "PREGTESTUR"   Cancer No results found for: "CEA", "CA125", "LABCA2"   Allergens No results found for: "ALMOND", "APPLE", "ASPARAGUS", "AVOCADO", "BANANA", "BARLEY", "BASIL", "BAYLEAF", "GREENBEAN", "LIMABEAN", "WHITEBEAN", "BEEFIGE", "REDBEET", "BLUEBERRY", "BROCCOLI", "CABBAGE", "MELON", "CARROT", "CASEIN", "CASHEWNUT", "CAULIFLOWER", "CELERY"     Note: Lab results reviewed.  PFSH  Drug: Ms. Mcnorton  has no history on file for drug use. Alcohol:  has no history on file for alcohol use. Tobacco:  reports that she has been smoking cigarettes. She has a 30.00  pack-year smoking history. She has never used smokeless tobacco. Medical:  has a past medical history of Anxiety, Depression, GERD (gastroesophageal reflux disease), Hyperlipidemia, and Hypertension. Family: family history is not on file.  History reviewed. No pertinent surgical history. Active Ambulatory Problems    Diagnosis Date Noted   Anxiety and depression 07/12/2020   DNR (do not resuscitate) 12/09/2019   Encounter for general adult medical examination without abnormal findings 03/31/2019   Essential hypertension 07/12/2020   GERD without esophagitis 01/01/2014   Hyperhidrosis 07/12/2020   Chronic radicular lumbar pain 05/13/2018   Obesity, morbid ([place]) 05/13/2018   Pain, generalized 08/30/2019   Pure hypercholesterolemia 07/12/2020   Tobacco dependence 05/13/2018   Uterine fibroid 08/08/2012   Spinal stenosis, lumbar region, with neurogenic claudication 01/14/2022   Lumbar spondylosis 01/14/2022   Lumbar degenerative disc disease 01/14/2022   Sacroiliac joint pain 01/14/2022   Chronic pain syndrome 01/14/2022   Resolved Ambulatory Problems    Diagnosis Date Noted   No Resolved Ambulatory Problems   Past Medical History:  Diagnosis Date   Anxiety    Depression    GERD (gastroesophageal reflux disease)    Hyperlipidemia    Hypertension    Constitutional Exam  General appearance: alert, cooperative, and morbidly obese Vitals:   01/14/22 0918  BP: (!) 165/110  Pulse: (!) 104  Temp: (!) 97.1 F (36.2 C)  TempSrc: Temporal  SpO2: 100%  Weight: 264 lb 1.6 oz (119.8 kg)  Height: 5' 6"  (1.676 m)  BMI Assessment: Estimated body mass index is 42.63 kg/m as calculated from the following:   Height as of this encounter: 5' 6"  (1.676 m).   Weight as of this encounter: 264 lb 1.6 oz (119.8 kg).  BMI interpretation table: BMI level Category Range association with higher incidence of chronic pain  <18 kg/m2 Underweight   18.5-24.9 kg/m2 Ideal body weight   25-29.9  kg/m2 Overweight Increased incidence by 20%  30-34.9 kg/m2 Obese (Class I) Increased incidence by 68%  35-39.9 kg/m2 Severe obesity (Class II) Increased incidence by 136%  >40 kg/m2 Extreme obesity (Class III) Increased incidence by 254%   Patient's current BMI Ideal Body weight  Body mass index is 42.63 kg/m. Ideal body weight: 59.3 kg (130 lb 11.7 oz) Adjusted ideal body weight: 83.5 kg (184 lb 1.3 oz)   BMI Readings from Last 4 Encounters:  01/14/22 42.63 kg/m  10/27/20 39.54 kg/m  07/12/20 39.71 kg/m  05/24/20 42.77 kg/m   Wt Readings from Last 4 Encounters:  01/14/22 264 lb 1.6 oz (119.8 kg)  10/27/20 245 lb (111.1 kg)  07/12/20 246 lb (111.6 kg)  05/24/20 265 lb (120.2 kg)    Psych/Mental status: Alert, oriented x 3 (person, place, & time)       Eyes: PERLA Respiratory: No evidence of acute respiratory distress  Lumbar Spine Area Exam  Skin & Axial Inspection: No masses, redness, or swelling Alignment: Symmetrical Functional ROM: Pain restricted ROM affecting both sides Stability: No instability detected Muscle Tone/Strength: Functionally intact. No obvious neuro-muscular anomalies detected. Sensory (Neurological): Dermatomal pain pattern and neurogenic  Provocative Tests: Hyperextension/rotation test: (+) bilaterally for facet joint pain. Lumbar quadrant test (Kemp's test): (+) bilateral for foraminal stenosis  Gait & Posture Assessment  Ambulation: Unassisted Gait: Modified gait pattern (slower gait speed, wider stride width, and longer stance duration) associated with morbid obesity Posture: Difficulty standing up straight, due to pain  Lower Extremity Exam    Side: Right lower extremity  Side: Left lower extremity  Stability: No instability observed          Stability: No instability observed          Skin & Extremity Inspection: Skin color, temperature, and hair growth are WNL. No peripheral edema or cyanosis. No masses, redness, swelling, asymmetry, or  associated skin lesions. No contractures.  Skin & Extremity Inspection: Skin color, temperature, and hair growth are WNL. No peripheral edema or cyanosis. No masses, redness, swelling, asymmetry, or associated skin lesions. No contractures.  Functional ROM: Pain restricted ROM for all joints of the lower extremity          Functional ROM: Pain restricted ROM for all joints of the lower extremity          Muscle Tone/Strength: Functionally intact. No obvious neuro-muscular anomalies detected.  Muscle Tone/Strength: Functionally intact. No obvious neuro-muscular anomalies detected.  Sensory (Neurological): Neurogenic pain pattern        Sensory (Neurological): Neurogenic pain pattern        DTR: Patellar: deferred today Achilles: deferred today Plantar: deferred today  DTR: Patellar: deferred today Achilles: deferred today Plantar: deferred today  Palpation: No palpable anomalies  Palpation: No palpable anomalies    Assessment  Primary Diagnosis & Pertinent Problem List: The primary encounter diagnosis was Spinal stenosis, lumbar region, with neurogenic claudication. Diagnoses of Chronic radicular lumbar pain, Lumbar facet arthropathy, Lumbar spondylosis, Lumbar degenerative disc disease, Sacroiliac joint pain, and Chronic pain syndrome were also pertinent to this visit.  Visit Diagnosis (New  problems to examiner): 1. Spinal stenosis, lumbar region, with neurogenic claudication   2. Chronic radicular lumbar pain   3. Lumbar facet arthropathy   4. Lumbar spondylosis   5. Lumbar degenerative disc disease   6. Sacroiliac joint pain   7. Chronic pain syndrome    Plan of Care (Initial workup plan)  Note: Ms. Capley was reminded that as per protocol, today's visit has been an evaluation only. We have not taken over the patient's controlled substance management.   1. Spinal stenosis, lumbar region, with neurogenic claudication - L4/5: marked canal stenosis with effacement of subarticular  recesses is stable to slightly improved. Moderate to marked right foraminal stenosis is similar. Decreased mild to moderate left foraminal stenosis. - pregabalin (LYRICA) 50 MG capsule; Take 1 capsule (50 mg total) by mouth at bedtime for 15 days, THEN 1 capsule (50 mg total) 2 (two) times daily.  Dispense: 75 capsule; Refill: 0 - Lumbar Epidural Injection; Future - Ambulatory referral to Physical Therapy  2. Chronic radicular lumbar pain - pregabalin (LYRICA) 50 MG capsule; Take 1 capsule (50 mg total) by mouth at bedtime for 15 days, THEN 1 capsule (50 mg total) 2 (two) times daily.  Dispense: 75 capsule; Refill: 0 - Lumbar Epidural Injection; Future - Ambulatory referral to Physical Therapy  3. Lumbar facet arthropathy - Ambulatory referral to Physical Therapy -Consider diagnostic lumbar facet medial branch nerve blocks  4. Lumbar spondylosis - Ambulatory referral to Physical Therapy  5. Lumbar degenerative disc disease - Ambulatory referral to Physical Therapy  6. Sacroiliac joint pain -Consider diagnostic SI joint injection. - Ambulatory referral to Physical Therapy  7. Chronic pain syndrome - pregabalin (LYRICA) 50 MG capsule; Take 1 capsule (50 mg total) by mouth at bedtime for 15 days, THEN 1 capsule (50 mg total) 2 (two) times daily.  Dispense: 75 capsule; Refill: 0 - Compliance Drug Analysis, Ur - Lumbar Epidural Injection; Future - Ambulatory referral to Physical Therapy   Lab Orders         Compliance Drug Analysis, Ur      Procedure Orders         Lumbar Epidural Injection    Pharmacotherapy (current): Medications ordered:  Meds ordered this encounter  Medications   pregabalin (LYRICA) 50 MG capsule    Sig: Take 1 capsule (50 mg total) by mouth at bedtime for 15 days, THEN 1 capsule (50 mg total) 2 (two) times daily.    Dispense:  75 capsule    Refill:  0    Fill one day early if pharmacy is closed on scheduled refill date. May substitute for generic if  available.   Medications administered during this visit: Shadia A. Eley had no medications administered during this visit.   Pharmacological management options:  Opioid Analgesics: The patient was informed that there is no guarantee that she would be a candidate for opioid analgesics. The decision will be made following CDC guidelines. This decision will be based on the results of diagnostic studies, as well as Ms. Wedeking's risk profile.   Membrane stabilizer:  Tried gabapentin and Cymbalta with limited response.  Trial of Lyrica as above  Muscle relaxant: To be determined at a later time  NSAID: To be determined at a later time  Other analgesic(s): To be determined at a later time   Interventional management options: Ms. Huron was informed that there is no guarantee that she would be a candidate for interventional therapies. The decision will be based on the results  of diagnostic studies, as well as Ms. Matzke's risk profile.  Procedure(s) under consideration:  Lumbar epidural steroid injection at L4-L5 Lumbar facet medial branch nerve blocks bilaterally at L3, L4, L5 SI joint injection  Provider-requested follow-up: Return in about 16 days (around 01/30/2022) for L4/5 ESI , in clinic NS.  I spent a total of 60 minutes reviewing chart data, face-to-face evaluation with the patient, counseling and coordination of care as detailed above.   Future Appointments  Date Time Provider [place]  01/29/2022 11:40 AM Gillis Santa, MD [facility] None    Note by: Gillis Santa, MD Date: 01/14/2022; Time: 10:56 AM

## 2022-01-14 NOTE — Patient Instructions (Addendum)
Epidural Steroid Injection Patient Information  Description: The epidural space surrounds the nerves as they exit the spinal cord.  In some patients, the nerves can be compressed and inflamed by a bulging disc or a tight spinal canal (spinal stenosis).  By injecting steroids into the epidural space, we can bring irritated nerves into direct contact with a potentially helpful medication.  These steroids act directly on the irritated nerves and can reduce swelling and inflammation which often leads to decreased pain.  Epidural steroids may be injected anywhere along the spine and from the neck to the low back depending upon the location of your pain.   After numbing the skin with local anesthetic (like Novocaine), a small needle is passed into the epidural space slowly.  You may experience a sensation of pressure while this is being done.  The entire block usually last less than 10 minutes.  Conditions which may be treated by epidural steroids:  Low back and leg pain Neck and arm pain Spinal stenosis Post-laminectomy syndrome Herpes zoster (shingles) pain Pain from compression fractures  Preparation for the injection:  Do not eat any solid food or dairy products within 8 hours of your appointment.  You may drink clear liquids up to 3 hours before appointment.  Clear liquids include water, black coffee, juice or soda.  No milk or cream please. You may take your regular medication, including pain medications, with a sip of water before your appointment  Diabetics should hold regular insulin (if taken separately) and take 1/2 normal NPH dos the morning of the procedure.  Carry some sugar containing items with you to your appointment. A driver must accompany you and be prepared to drive you home after your procedure.  Bring all your current medications with your. An IV may be inserted and sedation may be given at the discretion of the physician.   A blood pressure cuff, EKG and other monitors will  often be applied during the procedure.  Some patients may need to have extra oxygen administered for a short period. You will be asked to provide medical information, including your allergies, prior to the procedure.  We must know immediately if you are taking blood thinners (like Coumadin/Warfarin)  Or if you are allergic to IV iodine contrast (dye). We must know if you could possible be pregnant.  Possible side-effects: Bleeding from needle site Infection (rare, may require surgery) Nerve injury (rare) Numbness & tingling (temporary) Difficulty urinating (rare, temporary) Spinal headache ( a headache worse with upright posture) Light -headedness (temporary) Pain at injection site (several days) Decreased blood pressure (temporary) Weakness in arm/leg (temporary) Pressure sensation in back/neck (temporary)  Call if you experience: Fever/chills associated with headache or increased back/neck pain. Headache worsened by an upright position. New onset weakness or numbness of an extremity below the injection site Hives or difficulty breathing (go to the emergency room) Inflammation or drainage at the infection site Severe back/neck pain Any new symptoms which are concerning to you  Please note:  Although the local anesthetic injected can often make your back or neck feel good for several hours after the injection, the pain will likely return.  It takes 3-7 days for steroids to work in the epidural space.  You may not notice any pain relief for at least that one week.  If effective, we will often do a series of three injections spaced 3-6 weeks apart to maximally decrease your pain.  After the initial series, we generally will wait several months before   considering a repeat injection of the same type.  If you have any questions, please call [phone] [place]  What are the risk, side effects and possible  complications? Generally speaking, most procedures are safe.  However, with any procedure there are risks, side effects, and the possibility of complications.  The risks and complications are dependent upon the sites that are lesioned, or the type of nerve block to be performed.  The closer the procedure is to the spine, the more serious the risks are.  Great care is taken when placing the radio frequency needles, block needles or lesioning probes, but sometimes complications can occur. Infection: Any time there is an injection through the skin, there is a risk of infection.  This is why sterile conditions are used for these blocks.  There are four possible types of infection. Localized skin infection. Central Nervous System Infection-This can be in the form of Meningitis, which can be deadly. Epidural Infections-This can be in the form of an epidural abscess, which can cause pressure inside of the spine, causing compression of the spinal cord with subsequent paralysis. This would require an emergency surgery to decompress, and there are no guarantees that the patient would recover from the paralysis. Discitis-This is an infection of the intervertebral discs.  It occurs in about 1% of discography procedures.  It is difficult to treat and it may lead to surgery.        2. Pain: the needles have to go through skin and soft tissues, will cause soreness.       3. Damage to internal structures:  The nerves to be lesioned may be near blood vessels or    other nerves which can be potentially damaged.       4. Bleeding: Bleeding is more common if the patient is taking blood thinners such as  aspirin, Coumadin, Ticiid, Plavix, etc., or if he/she have some genetic predisposition  such as hemophilia. Bleeding into the spinal canal can cause compression of the spinal  cord with subsequent paralysis.  This would require an emergency surgery to  decompress and there are no guarantees that the patient would recover from  the  paralysis.       5. Pneumothorax:  Puncturing of a lung is a possibility, every time a needle is introduced in  the area of the chest or upper back.  Pneumothorax refers to free air around the  collapsed lung(s), inside of the thoracic cavity (chest cavity).  Another two possible  complications related to a similar event would include: Hemothorax and Chylothorax.   These are variations of the Pneumothorax, where instead of air around the collapsed  lung(s), you may have blood or chyle, respectively.       6. Spinal headaches: They may occur with any procedures in the area of the spine.       7. Persistent CSF (Cerebro-Spinal Fluid) leakage: This is a rare problem, but may occur  with prolonged intrathecal or epidural catheters either due to the formation of a fistulous  track or a dural tear.       8. Nerve damage: By working so close to the spinal cord, there is always a possibility of  nerve damage, which could be as serious as a permanent spinal cord injury with  paralysis.       9. Death:  Although rare, severe deadly allergic reactions known as "Anaphylactic  reaction" can occur to any of the  medications used.      10. Worsening of the symptoms:  We can always make thing worse.  What are the chances of something like this happening? Chances of any of this occuring are extremely low.  By statistics, you have more of a chance of getting killed in a motor vehicle accident: while driving to the hospital than any of the above occurring .  Nevertheless, you should be aware that they are possibilities.  In general, it is similar to taking a shower.  Everybody knows that you can slip, hit your head and get killed.  Does that mean that you should not shower again?  Nevertheless always keep in mind that statistics do not mean anything if you happen to be on the wrong side of them.  Even if a procedure has a 1 (one) in a 1,000,000 (million) chance of going wrong, it you happen to be that one..Also, keep in  mind that by statistics, you have more of a chance of having something go wrong when taking medications.  Who should not have this procedure? If you are on a blood thinning medication (e.g. Coumadin, Plavix, see list of "Blood Thinners"), or if you have an active infection going on, you should not have the procedure.  If you are taking any blood thinners, please inform your physician.  How should I prepare for this procedure? Do not eat or drink anything at least six hours prior to the procedure. Bring a driver with you .  It cannot be a taxi. Come accompanied by an adult that can drive you back, and that is strong enough to help you if your legs get weak or numb from the local anesthetic. Take all of your medicines the morning of the procedure with just enough water to swallow them. If you have diabetes, make sure that you are scheduled to have your procedure done first thing in the morning, whenever possible. If you have diabetes, take only half of your insulin dose and notify our nurse that you have done so as soon as you arrive at the clinic. If you are diabetic, but only take blood sugar pills (oral hypoglycemic), then do not take them on the morning of your procedure.  You may take them after you have had the procedure. Do not take aspirin or any aspirin-containing medications, at least eleven (11) days prior to the procedure.  They may prolong bleeding. Wear loose fitting clothing that may be easy to take off and that you would not mind if it got stained with Betadine or blood. Do not wear any jewelry or perfume Remove any nail coloring.  It will interfere with some of our monitoring equipment.  NOTE: Remember that this is not meant to be interpreted as a complete list of all possible complications.  Unforeseen problems may occur.  BLOOD THINNERS The following drugs contain aspirin or other products, which can cause increased bleeding during surgery and should not be taken for 2 weeks  prior to and 1 week after surgery.  If you should need take something for relief of minor pain, you may take acetaminophen which is found in Tylenol,m Datril, Anacin-3 and Panadol. It is not blood thinner. The products listed below are.  Do not take any of the products listed below in addition to any listed on your instruction sheet.  A.P.C or A.P.C with Codeine Codeine Phosphate Capsules #3 Ibuprofen Ridaura  ABC compound Congesprin Imuran rimadil  Advil Cope Indocin Robaxisal  Alka-Seltzer Effervescent Pain Reliever  and Antacid Coricidin or Coricidin-D  Indomethacin Rufen  Alka-Seltzer plus Cold Medicine Cosprin Ketoprofen S-A-C Tablets  Anacin Analgesic Tablets or Capsules Coumadin Korlgesic Salflex  Anacin Extra Strength Analgesic tablets or capsules CP-2 Tablets Lanoril Salicylate  Anaprox Cuprimine Capsules Levenox Salocol  Anexsia-D Dalteparin Magan Salsalate  Anodynos Darvon compound Magnesium Salicylate Sine-off  Ansaid Dasin Capsules Magsal Sodium Salicylate  Anturane Depen Capsules Marnal Soma  APF Arthritis pain formula Dewitt's Pills Measurin Stanback  Argesic Dia-Gesic Meclofenamic Sulfinpyrazone  Arthritis Bayer Timed Release Aspirin Diclofenac Meclomen Sulindac  Arthritis pain formula Anacin Dicumarol Medipren Supac  Analgesic (Safety coated) Arthralgen Diffunasal Mefanamic Suprofen  Arthritis Strength Bufferin Dihydrocodeine Mepro Compound Suprol  Arthropan liquid Dopirydamole Methcarbomol with Aspirin Synalgos  ASA tablets/Enseals Disalcid Micrainin Tagament  Ascriptin Doan's Midol Talwin  Ascriptin A/D Dolene Mobidin Tanderil  Ascriptin Extra Strength Dolobid Moblgesic Ticlid  Ascriptin with Codeine Doloprin or Doloprin with Codeine Momentum Tolectin  Asperbuf Duoprin Mono-gesic Trendar  Aspergum Duradyne Motrin or Motrin IB Triminicin  Aspirin plain, buffered or enteric coated Durasal Myochrisine Trigesic  Aspirin Suppositories Easprin Nalfon Trillsate  Aspirin with  Codeine Ecotrin Regular or Extra Strength Naprosyn Uracel  Atromid-S Efficin Naproxen Ursinus  Auranofin Capsules Elmiron Neocylate Vanquish  Axotal Emagrin Norgesic Verin  Azathioprine Empirin or Empirin with Codeine Normiflo Vitamin E  Azolid Emprazil Nuprin Voltaren  Bayer Aspirin plain, buffered or children's or timed BC Tablets or powders Encaprin Orgaran Warfarin Sodium  Buff-a-Comp Enoxaparin Orudis Zorpin  Buff-a-Comp with Codeine Equegesic Os-Cal-Gesic   Buffaprin Excedrin plain, buffered or Extra Strength Oxalid   Bufferin Arthritis Strength Feldene Oxphenbutazone   Bufferin plain or Extra Strength Feldene Capsules Oxycodone with Aspirin   Bufferin with Codeine Fenoprofen Fenoprofen Pabalate or Pabalate-SF   Buffets II Flogesic Panagesic   Buffinol plain or Extra Strength Florinal or Florinal with Codeine Panwarfarin   Buf-Tabs Flurbiprofen Penicillamine   Butalbital Compound Four-way cold tablets Penicillin   Butazolidin Fragmin Pepto-Bismol   Carbenicillin Geminisyn Percodan   Carna Arthritis Reliever Geopen Persantine   Carprofen Gold's salt Persistin   Chloramphenicol Goody's Phenylbutazone   Chloromycetin Haltrain Piroxlcam   Clmetidine heparin Plaquenil   Cllnoril Hyco-pap Ponstel   Clofibrate Hydroxy chloroquine Propoxyphen         Before stopping any of these medications, be sure to consult the physician who ordered them.  Some, such as Coumadin (Warfarin) are ordered to prevent or treat serious conditions such as "deep thrombosis", "pumonary embolisms", and other heart problems.  The amount of time that you may need off of the medication may also vary with the medication and the reason for which you were taking it.  If you are taking any of these medications, please make sure you notify your pain physician before you undergo any procedures.

## 2022-01-17 LAB — COMPLIANCE DRUG ANALYSIS, UR

## 2022-01-23 ENCOUNTER — Ambulatory Visit: Payer: Medicare Other | Admitting: Student in an Organized Health Care Education/Training Program

## 2022-01-29 ENCOUNTER — Ambulatory Visit
Admission: RE | Admit: 2022-01-29 | Discharge: 2022-01-29 | Disposition: A | Discharge status: Home / Self Care | Payer: Medicare Other | Source: Ambulatory Visit | Attending: Student in an Organized Health Care Education/Training Program | Admitting: Student in an Organized Health Care Education/Training Program

## 2022-01-29 ENCOUNTER — Encounter: Payer: Self-pay | Admitting: Student in an Organized Health Care Education/Training Program

## 2022-01-29 ENCOUNTER — Ambulatory Visit
Payer: Medicare Other | Attending: Student in an Organized Health Care Education/Training Program | Admitting: Student in an Organized Health Care Education/Training Program

## 2022-01-29 VITALS — BP 145/87 | HR 95 | Temp 97.2°F | Resp 18 | Ht 66.0 in | Wt 265.0 lb

## 2022-01-29 DIAGNOSIS — G8929 Other chronic pain: Secondary | ICD-10-CM | POA: Insufficient documentation

## 2022-01-29 DIAGNOSIS — M5416 Radiculopathy, lumbar region: Secondary | ICD-10-CM | POA: Insufficient documentation

## 2022-01-29 DIAGNOSIS — M48062 Spinal stenosis, lumbar region with neurogenic claudication: Secondary | ICD-10-CM | POA: Insufficient documentation

## 2022-01-29 DIAGNOSIS — G894 Chronic pain syndrome: Secondary | ICD-10-CM | POA: Insufficient documentation

## 2022-01-29 MED ORDER — ROPIVACAINE HCL 2 MG/ML IJ SOLN
2.0000 mL | Freq: Once | INTRAMUSCULAR | Status: AC
  Administered 2022-01-29: 2 mL via EPIDURAL

## 2022-01-29 MED ORDER — LIDOCAINE HCL 2 % IJ SOLN
20.0000 mL | Freq: Once | INTRAMUSCULAR | Status: AC
  Administered 2022-01-29: 100 mg

## 2022-01-29 MED ORDER — IOHEXOL 180 MG/ML  SOLN
INTRAMUSCULAR | Status: AC
  Filled 2022-01-29: qty 20

## 2022-01-29 MED ORDER — DEXAMETHASONE SODIUM PHOSPHATE 10 MG/ML IJ SOLN
10.0000 mg | Freq: Once | INTRAMUSCULAR | Status: AC
  Administered 2022-01-29: 10 mg

## 2022-01-29 MED ORDER — DEXAMETHASONE SODIUM PHOSPHATE 10 MG/ML IJ SOLN
INTRAMUSCULAR | Status: AC
  Filled 2022-01-29: qty 1

## 2022-01-29 MED ORDER — IOHEXOL 180 MG/ML  SOLN
10.0000 mL | Freq: Once | INTRAMUSCULAR | Status: AC
  Administered 2022-01-29: 10 mL via EPIDURAL

## 2022-01-29 MED ORDER — ROPIVACAINE HCL 2 MG/ML IJ SOLN
INTRAMUSCULAR | Status: AC
  Filled 2022-01-29: qty 20

## 2022-01-29 MED ORDER — SODIUM CHLORIDE (PF) 0.9 % IJ SOLN
INTRAMUSCULAR | Status: AC
  Filled 2022-01-29: qty 10

## 2022-01-29 MED ORDER — SODIUM CHLORIDE 0.9% FLUSH
2.0000 mL | Freq: Once | INTRAVENOUS | Status: AC
  Administered 2022-01-29: 2 mL

## 2022-01-29 MED ORDER — LIDOCAINE HCL (PF) 2 % IJ SOLN
INTRAMUSCULAR | Status: AC
  Filled 2022-01-29: qty 10

## 2022-01-29 NOTE — Progress Notes (Signed)
PROVIDER NOTE: Interpretation of information contained herein should be left to medically-trained personnel. Specific patient instructions are provided elsewhere under "Patient Instructions" section of medical record. This document was created in part using STT-dictation technology, any transcriptional errors that may result from this process are unintentional.  Patient: Heather Hartman Type: Established DOB: 03/25/1951 MRN: [id number] PCP: Gladstone Lighter, MD  Service: Procedure DOS: 01/29/2022 Setting: Ambulatory Location: Ambulatory outpatient facility Delivery: Face-to-face Provider: Gillis Santa, MD Specialty: Interventional Pain Management Specialty designation: 09 Location: Outpatient facility Ref. Prov.: Gladstone Lighter, MD    Primary Reason for Visit: Interventional Pain Management Treatment. CC: Back Pain (Low back bilateral)   Procedure:           Type: Lumbar epidural steroid injection (LESI) (interlaminar) #1    Laterality: Right   Level:  L4-5 Level.  Imaging: Fluoroscopic guidance         Anesthesia: Local anesthesia (1-2% Lidocaine) DOS: 01/29/2022  Performed by: Gillis Santa, MD  Purpose: Diagnostic/Therapeutic Indications: Lumbar radicular pain of intraspinal etiology of more than 4 weeks that has failed to respond to conservative therapy and is severe enough to impact quality of life or function. 1. Spinal stenosis, lumbar region, with neurogenic claudication   2. Chronic radicular lumbar pain   3. Chronic pain syndrome    NAS-11 Pain score:   Pre-procedure: 8 /10   Post-procedure: 8 /10      Position / Prep / Materials:  Position: Prone w/ head of the table raised (slight reverse trendelenburg) to facilitate breathing.  Prep solution: DuraPrep (Iodine Povacrylex [0.7% available iodine] and Isopropyl Alcohol, 74% w/w) Prep Area: Entire Posterior Lumbar Region from lower scapular tip down to mid buttocks area and from flank to flank. Materials:  Tray:  Epidural tray Needle(s):  Type: Epidural needle (Tuohy) Gauge (G):  17 Length: Regular (3.5-in) Qty: 1  Pre-op H&P Assessment:  Heather Hartman is a 71 y.o. (year old), female patient, seen today for interventional treatment. She  has no past surgical history on file. Heather Hartman has a current medication list which includes the following prescription(s): acetaminophen, furosemide, lisinopril-hydrochlorothiazide, magic mouthwash w/lidocaine, potassium chloride er, pregabalin, and tramadol. Her primarily concern today is the Back Pain (Low back bilateral)  Initial Vital Signs:  Pulse/HCG Rate: 95ECG Heart Rate: (!) 112 (ST with PVCs) Temp: (!) 97.2 F (36.2 C) Resp: 18 BP: (!) 172/76 SpO2: 100 %  BMI: Estimated body mass index is 42.77 kg/m as calculated from the following:   Height as of this encounter: '5\' 6"'$  (1.676 m).   Weight as of this encounter: 265 lb (120.2 kg).  Risk Assessment: Allergies: Reviewed. She is allergic to vioxx [rofecoxib] and atorvastatin.  Allergy Precautions: None required Coagulopathies: Reviewed. None identified.  Blood-thinner therapy: None at this time Active Infection(s): Reviewed. None identified. Heather Hartman is afebrile  Site Confirmation: Heather Hartman was asked to confirm the procedure and laterality before marking the site Procedure checklist: Completed Consent: Before the procedure and under the influence of no sedative(s), amnesic(s), or anxiolytics, the patient was informed of the treatment options, risks and possible complications. To fulfill our ethical and legal obligations, as recommended by the American Medical Association's Code of Ethics, I have informed the patient of my clinical impression; the nature and purpose of the treatment or procedure; the risks, benefits, and possible complications of the intervention; the alternatives, including doing nothing; the risk(s) and benefit(s) of the alternative treatment(s) or procedure(s); and the risk(s) and benefit(s)  of doing nothing. The  patient was provided information about the general risks and possible complications associated with the procedure. These may include, but are not limited to: failure to achieve desired goals, infection, bleeding, organ or nerve damage, allergic reactions, paralysis, and death. In addition, the patient was informed of those risks and complications associated to Spine-related procedures, such as failure to decrease pain; infection (i.e.: Meningitis, epidural or intraspinal abscess); bleeding (i.e.: epidural hematoma, subarachnoid hemorrhage, or any other type of intraspinal or peri-dural bleeding); organ or nerve damage (i.e.: Any type of peripheral nerve, nerve root, or spinal cord injury) with subsequent damage to sensory, motor, and/or autonomic systems, resulting in permanent pain, numbness, and/or weakness of one or several areas of the body; allergic reactions; (i.e.: anaphylactic reaction); and/or death. Furthermore, the patient was informed of those risks and complications associated with the medications. These include, but are not limited to: allergic reactions (i.e.: anaphylactic or anaphylactoid reaction(s)); adrenal axis suppression; blood sugar elevation that in diabetics may result in ketoacidosis or comma; water retention that in patients with history of congestive heart failure may result in shortness of breath, pulmonary edema, and decompensation with resultant heart failure; weight gain; swelling or edema; medication-induced neural toxicity; particulate matter embolism and blood vessel occlusion with resultant organ, and/or nervous system infarction; and/or aseptic necrosis of one or more joints. Finally, the patient was informed that Medicine is not an exact science; therefore, there is also the possibility of unforeseen or unpredictable risks and/or possible complications that may result in a catastrophic outcome. The patient indicated having understood very clearly. We  have given the patient no guarantees and we have made no promises. Enough time was given to the patient to ask questions, all of which were answered to the patient's satisfaction. Ms. Carbin has indicated that she wanted to continue with the procedure. Attestation: I, the ordering provider, attest that I have discussed with the patient the benefits, risks, side-effects, alternatives, likelihood of achieving goals, and potential problems during recovery for the procedure that I have provided informed consent. Date  Time: 01/29/2022 11:05 AM  Pre-Procedure Preparation:  Monitoring: As per clinic protocol. Respiration, ETCO2, SpO2, BP, heart rate and rhythm monitor placed and checked for adequate function Safety Precautions: Patient was assessed for positional comfort and pressure points before starting the procedure. Time-out: I initiated and conducted the "Time-out" before starting the procedure, as per protocol. The patient was asked to participate by confirming the accuracy of the "Time Out" information. Verification of the correct person, site, and procedure were performed and confirmed by me, the nursing staff, and the patient. "Time-out" conducted as per Joint Commission's Universal Protocol (UP.01.01.01). Time: 1127  Description/Narrative of Procedure:          Target: Epidural space via interlaminar opening, initially targeting the lower laminar border of the superior vertebral body. Region: Lumbar Approach: Percutaneous paravertebral  Rationale (medical necessity): procedure needed and proper for the diagnosis and/or treatment of the patient's medical symptoms and needs. Procedural Technique Safety Precautions: Aspiration looking for blood return was conducted prior to all injections. At no point did we inject any substances, as a needle was being advanced. No attempts were made at seeking any paresthesias. Safe injection practices and needle disposal techniques used. Medications properly  checked for expiration dates. SDV (single dose vial) medications used. Description of the Procedure: Protocol guidelines were followed. The procedure needle was introduced through the skin, ipsilateral to the reported pain, and advanced to the target area. Bone was contacted and the needle walked  caudad, until the lamina was cleared. The epidural space was identified using "loss-of-resistance technique" with 2-3 ml of PF-NaCl (0.9% NSS), in a 5cc LOR glass syringe.  Vitals:   01/29/22 1109 01/29/22 1110 01/29/22 1125 01/29/22 1131  BP: (!) 172/76  (!) 148/96 (!) 145/87  Pulse: 95     Resp: '18  16 18  '$ Temp:  (!) 97.2 F (36.2 C)    SpO2: 100%  99% 100%  Weight: 265 lb (120.2 kg)     Height: '5\' 6"'$  (1.676 m)       6cc solution made of 3 cc of preservative-free saline, 2 cc of 0.2% ropivacaine, 1 cc of Decadron 10 mg/cc.   Start Time: 1127 hrs. End Time: 1131 hrs.  Imaging Guidance (Spinal):          Type of Imaging Technique: Fluoroscopy Guidance (Spinal) Indication(s): Assistance in needle guidance and placement for procedures requiring needle placement in or near specific anatomical locations not easily accessible without such assistance. Exposure Time: Please see nurses notes. Contrast: Before injecting any contrast, we confirmed that the patient did not have an allergy to iodine, shellfish, or radiological contrast. Once satisfactory needle placement was completed at the desired level, radiological contrast was injected. Contrast injected under live fluoroscopy. No contrast complications. See chart for type and volume of contrast used. Fluoroscopic Guidance: I was personally present during the use of fluoroscopy. "Tunnel Vision Technique" used to obtain the best possible view of the target area. Parallax error corrected before commencing the procedure. "Direction-depth-direction" technique used to introduce the needle under continuous pulsed fluoroscopy. Once target was reached,  antero-posterior, oblique, and lateral fluoroscopic projection used confirm needle placement in all planes. Images permanently stored in EMR. Interpretation: I personally interpreted the imaging intraoperatively. Adequate needle placement confirmed in multiple planes. Appropriate spread of contrast into desired area was observed. No evidence of afferent or efferent intravascular uptake. No intrathecal or subarachnoid spread observed. Permanent images saved into the patient's record.  Antibiotic Prophylaxis:   Anti-infectives (From admission, onward)    None      Indication(s): None identified  Post-operative Assessment:  Post-procedure Vital Signs:  Pulse/HCG Rate: 95(!) 101 Temp: (!) 97.2 F (36.2 C) Resp: 18 BP: (!) 145/87 SpO2: 100 %  EBL: None  Complications: No immediate post-treatment complications observed by team, or reported by patient.  Note: The patient tolerated the entire procedure well. A repeat set of vitals were taken after the procedure and the patient was kept under observation following institutional policy, for this type of procedure. Post-procedural neurological assessment was performed, showing return to baseline, prior to discharge. The patient was provided with post-procedure discharge instructions, including a section on how to identify potential problems. Should any problems arise concerning this procedure, the patient was given instructions to immediately contact us, at any time, without hesitation. In any case, we plan to contact the patient by telephone for a follow-up status report regarding this interventional procedure.  Comments:  No additional relevant information.  Plan of Care  Orders:  Orders Placed This Encounter  Procedures   DG PAIN CLIN[facility] C-ARM 1-60 MIN NO REPORT    Intraoperative interpretation by procedural physician at [facility].    Standing Status:   Standing    Number of Occurrences:   1    Order Specific Question:    Reason for exam:    Answer:   Assistance in needle guidance and placement for procedures requiring needle placement in or near specific anatomical locations  not easily accessible without such assistance.     Medications ordered for procedure: Meds ordered this encounter  Medications   iohexol (OMNIPAQUE) 180 MG/ML injection 10 mL    Must be Myelogram-compatible. If not available, you may substitute with a water-soluble, non-ionic, hypoallergenic, myelogram-compatible radiological contrast medium.   lidocaine (XYLOCAINE) 2 % (with pres) injection 400 mg   sodium chloride flush (NS) 0.9 % injection 2 mL   ropivacaine (PF) 2 mg/mL (0.2%) (NAROPIN) injection 2 mL   dexamethasone (DECADRON) injection 10 mg   Medications administered: We administered iohexol, lidocaine, sodium chloride flush, ropivacaine (PF) 2 mg/mL (0.2%), and dexamethasone.  See the medical record for exact dosing, route, and time of administration.  Follow-up plan:   Return in about 3 weeks (around 02/19/2022) for Post Procedure Evaluation (s/p L-ESI), Medication Management (discuss Lyrica), in person.       L4-5 ESI 01/29/2022   Recent Visits Date Type Provider Dept  01/14/22 Office Visit Gillis Santa, MD [facility]  Showing recent visits within past 90 days and meeting all other requirements Today's Visits Date Type Provider Dept  01/29/22 Procedure visit Gillis Santa, MD [facility]  Showing today's visits and meeting all other requirements Future Appointments Date Type Provider Dept  02/19/22 Appointment Gillis Santa, MD [facility]  Showing future appointments within next 90 days and meeting all other requirements  Disposition: Discharge home  Discharge (Date  Time): 01/29/2022; 1145 hrs.   Primary Care Physician: Gladstone Lighter, MD Location: [facility] Note by: Gillis Santa, MD Date: 01/29/2022; Time: 12:00 PM  Disclaimer:  Medicine is  not an exact science. The only guarantee in medicine is that nothing is guaranteed. It is important to note that the decision to proceed with this intervention was based on the information collected from the patient. The Data and conclusions were drawn from the patient's questionnaire, the interview, and the physical examination. Because the information was provided in large part by the patient, it cannot be guaranteed that it has not been purposely or unconsciously manipulated. Every effort has been made to obtain as much relevant data as possible for this evaluation. It is important to note that the conclusions that lead to this procedure are derived in large part from the available data. Always take into account that the treatment will also be dependent on availability of resources and existing treatment guidelines, considered by other Pain Management Practitioners as being common knowledge and practice, at the time of the intervention. For Medico-Legal purposes, it is also important to point out that variation in procedural techniques and pharmacological choices are the acceptable norm. The indications, contraindications, technique, and results of the above procedure should only be interpreted and judged by a Board-Certified Interventional Pain Specialist with extensive familiarity and expertise in the same exact procedure and technique.

## 2022-01-29 NOTE — Patient Instructions (Signed)
Pain Management Discharge Instructions  General Discharge Instructions :  If you need to reach your doctor call: Monday-Friday 8:00 am - 4:00 pm at [phone] or toll free [phone].  After clinic hours [phone] to have operator reach doctor.  Bring all of your medication bottles to all your appointments in the pain clinic.  To cancel or reschedule your appointment with Pain Management please remember to call 24 hours in advance to avoid a fee.  Refer to the educational materials which you have been given on: General Risks, I had my Procedure. Discharge Instructions, Post Sedation.  Post Procedure Instructions:  The drugs you were given will stay in your system until tomorrow, so for the next 24 hours you should not drive, make any legal decisions or drink any alcoholic beverages.  You may eat anything you prefer, but it is better to start with liquids then soups and crackers, and gradually work up to solid foods.  Please notify your doctor immediately if you have any unusual bleeding, trouble breathing or pain that is not related to your normal pain.  Depending on the type of procedure that was done, some parts of your body may feel week and/or numb.  This usually clears up by tonight or the next day.  Walk with the use of an assistive device or accompanied by an adult for the 24 hours.  You may use ice on the affected area for the first 24 hours.  Put ice in a Ziploc bag and cover with a towel and place against area 15 minutes on 15 minutes off.  You may switch to heat after 24 hours.Epidural Steroid Injection Patient Information  Description: The epidural space surrounds the nerves as they exit the spinal cord.  In some patients, the nerves can be compressed and inflamed by a bulging disc or a tight spinal canal (spinal stenosis).  By injecting steroids into the epidural space, we can bring irritated nerves into direct contact with a potentially helpful medication.  These  steroids act directly on the irritated nerves and can reduce swelling and inflammation which often leads to decreased pain.  Epidural steroids may be injected anywhere along the spine and from the neck to the low back depending upon the location of your pain.   After numbing the skin with local anesthetic (like Novocaine), a small needle is passed into the epidural space slowly.  You may experience a sensation of pressure while this is being done.  The entire block usually last less than 10 minutes.  Conditions which may be treated by epidural steroids:  Low back and leg pain Neck and arm pain Spinal stenosis Post-laminectomy syndrome Herpes zoster (shingles) pain Pain from compression fractures  Preparation for the injection:  Do not eat any solid food or dairy products within 8 hours of your appointment.  You may drink clear liquids up to 3 hours before appointment.  Clear liquids include water, black coffee, juice or soda.  No milk or cream please. You may take your regular medication, including pain medications, with a sip of water before your appointment  Diabetics should hold regular insulin (if taken separately) and take 1/2 normal NPH dos the morning of the procedure.  Carry some sugar containing items with you to your appointment. A driver must accompany you and be prepared to drive you home after your procedure.  Bring all your current medications with your. An IV may be inserted and sedation may be given at the discretion of the physician.     A blood pressure cuff, EKG and other monitors will often be applied during the procedure.  Some patients may need to have extra oxygen administered for a short period. You will be asked to provide medical information, including your allergies, prior to the procedure.  We must know immediately if you are taking blood thinners (like Coumadin/Warfarin)  Or if you are allergic to IV iodine contrast (dye). We must know if you could possible be  pregnant.  Possible side-effects: Bleeding from needle site Infection (rare, may require surgery) Nerve injury (rare) Numbness & tingling (temporary) Difficulty urinating (rare, temporary) Spinal headache ( a headache worse with upright posture) Light -headedness (temporary) Pain at injection site (several days) Decreased blood pressure (temporary) Weakness in arm/leg (temporary) Pressure sensation in back/neck (temporary)  Call if you experience: Fever/chills associated with headache or increased back/neck pain. Headache worsened by an upright position. New onset weakness or numbness of an extremity below the injection site Hives or difficulty breathing (go to the emergency room) Inflammation or drainage at the infection site Severe back/neck pain Any new symptoms which are concerning to you  Please note:  Although the local anesthetic injected can often make your back or neck feel good for several hours after the injection, the pain will likely return.  It takes 3-7 days for steroids to work in the epidural space.  You may not notice any pain relief for at least that one week.  If effective, we will often do a series of three injections spaced 3-6 weeks apart to maximally decrease your pain.  After the initial series, we generally will wait several months before considering a repeat injection of the same type.  If you have any questions, please call [phone] [facility] 

## 2022-01-29 NOTE — Progress Notes (Signed)
Safety precautions to be maintained throughout the outpatient stay will include: orient to surroundings, keep bed in low position, maintain call bell within reach at all times, provide assistance with transfer out of bed and ambulation.  

## 2022-01-30 ENCOUNTER — Telehealth: Payer: Self-pay

## 2022-01-30 NOTE — Telephone Encounter (Signed)
Post procedure phone call.  Patient states she is doinig well.

## 2022-02-19 ENCOUNTER — Ambulatory Visit
Payer: Medicare Other | Attending: Student in an Organized Health Care Education/Training Program | Admitting: Student in an Organized Health Care Education/Training Program

## 2022-02-19 ENCOUNTER — Encounter: Payer: Self-pay | Admitting: Student in an Organized Health Care Education/Training Program

## 2022-02-19 VITALS — BP 143/79 | HR 84 | Temp 97.2°F | Resp 16 | Ht 66.0 in | Wt 266.0 lb

## 2022-02-19 DIAGNOSIS — M48062 Spinal stenosis, lumbar region with neurogenic claudication: Secondary | ICD-10-CM | POA: Insufficient documentation

## 2022-02-19 DIAGNOSIS — M5416 Radiculopathy, lumbar region: Secondary | ICD-10-CM | POA: Diagnosis present

## 2022-02-19 DIAGNOSIS — G8929 Other chronic pain: Secondary | ICD-10-CM | POA: Insufficient documentation

## 2022-02-19 DIAGNOSIS — G894 Chronic pain syndrome: Secondary | ICD-10-CM | POA: Diagnosis present

## 2022-02-19 DIAGNOSIS — Z0289 Encounter for other administrative examinations: Secondary | ICD-10-CM | POA: Diagnosis present

## 2022-02-19 MED ORDER — TRAMADOL HCL 50 MG PO TABS: 50.0000 mg | ORAL_TABLET | Freq: Every day | ORAL | 1 refills | Status: DC | PRN

## 2022-02-19 MED ORDER — DULOXETINE HCL 20 MG PO CPEP: ORAL_CAPSULE | ORAL | 0 refills | Status: DC

## 2022-02-19 NOTE — Progress Notes (Signed)
PROVIDER NOTE: Information contained herein reflects review and annotations entered in association with encounter. Interpretation of such information and data should be left to medically-trained personnel. Information provided to patient can be located elsewhere in the medical record under "Patient Instructions". Document created using STT-dictation technology, any transcriptional errors that may result from process are unintentional.    Patient: Heather Hartman  Service Category: E/M  Provider: Gillis Santa, MD  DOB: [birth year]  DOS: 02/19/2022  Referring Provider: Gladstone Lighter, MD  MRN: [id number]  Specialty: Interventional Pain Management  PCP: Gladstone Lighter, MD  Type: Established Patient  Setting: Ambulatory outpatient    Location: Office  Delivery: Face-to-face     HPI  [name], a 71 y.o. year old female, is here today because of her Spinal stenosis, lumbar region, with neurogenic claudication [M48.062]. [name] primary complain today is Hip Pain (bilateral) Last encounter: My last encounter with her was on 01/29/2022. Pertinent problems: [name] has Chronic radicular lumbar pain; Spinal stenosis, lumbar region, with neurogenic claudication; Lumbar spondylosis; Lumbar degenerative disc disease; Sacroiliac joint pain; Chronic pain syndrome; and Pain management contract signed on their pertinent problem list. Pain Assessment: Severity of Chronic pain is reported as a 7 /10. Location: Hip Right, Left/both legs to the feet. Onset: More than a month ago. Quality: Heaviness. Timing: Constant. Modifying factor(s): Tramadol. Vitals:  height is 5' 6"  (1.676 m) and weight is 266 lb (120.7 kg). Her temporal temperature is 97.2 F (36.2 C) (abnormal). Her blood pressure is 143/79 (abnormal) and her pulse is 84. Her respiration is 16 and oxygen saturation is 100%.   Reason for encounter: both, medication management and post-procedure evaluation and assessment.    Post-procedure  evaluation   Type: Lumbar epidural steroid injection (LESI) (interlaminar) #1    Laterality: Right   Level:  L4-5 Level.  Imaging: Fluoroscopic guidance         Anesthesia: Local anesthesia (1-2% Lidocaine) DOS: 01/29/2022  Performed by: Gillis Santa, MD  Purpose: Diagnostic/Therapeutic Indications: Lumbar radicular pain of intraspinal etiology of more than 4 weeks that has failed to respond to conservative therapy and is severe enough to impact quality of life or function. 1. Spinal stenosis, lumbar region, with neurogenic claudication   2. Chronic radicular lumbar pain   3. Chronic pain syndrome    NAS-11 Pain score:   Pre-procedure: 8 /10   Post-procedure: 8 /10      Effectiveness:  Initial hour after procedure: 0 %  Subsequent 4-6 hours post-procedure: 0 %  Analgesia past initial 6 hours: 90 %  Ongoing improvement:  Analgesic:  75-80% Function: [name] reports improvement in function ROM: [name] reports improvement in ROM   Pharmacotherapy Assessment  Analgesic: Tramadol 50 mg daily prn, new Rx today  Patient to sign pain contract today  Monitoring: [place] PMP: PDMP reviewed during this encounter.       Pharmacotherapy: No side-effects or adverse reactions reported. Compliance: No problems identified. Effectiveness: Clinically acceptable.  No notes on file  No results found for: "CBDTHCR" No results found for: "D8THCCBX" No results found for: "D9THCCBX"  UDS:  Summary  Date Value Ref Range Status  01/14/2022 Note  Final    Comment:    ==================================================================== Compliance Drug Analysis, Ur ==================================================================== Test                             Result       Flag  Units  Drug Present and Declared for Prescription Verification   Tramadol                       >1976        EXPECTED   ng/mg creat   O-Desmethyltramadol            >1976        EXPECTED   ng/mg creat    N-Desmethyltramadol            769          EXPECTED   ng/mg creat    Source of tramadol is a prescription medication. O-desmethyltramadol    and N-desmethyltramadol are expected metabolites of tramadol.    Acetaminophen                  PRESENT      EXPECTED  Drug Present not Declared for Prescription Verification   Naproxen                       PRESENT      UNEXPECTED   Diphenhydramine                PRESENT      UNEXPECTED  Drug Absent but Declared for Prescription Verification   Pregabalin                     Not Detected UNEXPECTED ==================================================================== Test                      Result    Flag   Units      Ref Range   Creatinine              253              mg/dL      >=20 ==================================================================== Declared Medications:  The flagging and interpretation on this report are based on the  following declared medications.  Unexpected results may arise from  inaccuracies in the declared medications.   **Note: The testing scope of this panel includes these medications:   Pregabalin (Lyrica)  Tramadol (Ultram)   **Note: The testing scope of this panel does not include small to  moderate amounts of these reported medications:   Acetaminophen (Tylenol)   **Note: The testing scope of this panel does not include the  following reported medications:   Furosemide (Lasix)  Hydrochlorothiazide (Zestoretic)  Lisinopril (Zestoretic)  Mouthwash  Potassium Chloride ==================================================================== For clinical consultation, please call [phone]. ====================================================================       ROS  Constitutional: Denies any fever or chills Gastrointestinal: No reported hemesis, hematochezia, vomiting, or acute GI distress Musculoskeletal:  Low back pain with radiation into bilateral hips and legs, improved after  LESI Neurological:  Improvement in lower extremity paresthesias after LESI  Medication Review  DULoxetine, acetaminophen, furosemide, lisinopril-hydrochlorothiazide, and traMADol  History Review  Allergy: [name] is allergic to vioxx [rofecoxib] and atorvastatin. Drug: [name]  has no history on file for drug use. Alcohol:  has no history on file for alcohol use. Tobacco:  reports that she has been smoking cigarettes. She has a 30.00 pack-year smoking history. She has never used smokeless tobacco. Social: [name]  reports that she has been smoking cigarettes. She has a 30.00 pack-year smoking history. She has never used smokeless tobacco. Medical:  has a past medical history of Anxiety, Depression, GERD (gastroesophageal reflux disease), Hyperlipidemia, and  Hypertension. Surgical: [name]  has no past surgical history on file. Family: family history is not on file.  Laboratory Chemistry Profile   Renal No results found for: "BUN", "CREATININE", "LABCREA", "BCR", "GFR", "GFRAA", "GFRNONAA", "LABVMA", "EPIRU", "EPINEPH24HUR", "NOREPRU", "NOREPI24HUR", "DOPARU", "DOPAM24HRUR"  Hepatic No results found for: "AST", "ALT", "ALBUMIN", "ALKPHOS", "HCVAB", "AMYLASE", "LIPASE", "AMMONIA"  Electrolytes No results found for: "NA", "K", "CL", "CALCIUM", "MG", "PHOS"  Bone No results found for: "VD25OH", "VD125OH2TOT", "UX8333OV2", "NV9166MA0", "25OHVITD1", "25OHVITD2", "25OHVITD3", "TESTOFREE", "TESTOSTERONE"  Inflammation (CRP: Acute Phase) (ESR: Chronic Phase) No results found for: "CRP", "ESRSEDRATE", "LATICACIDVEN"       Note: Above Lab results reviewed.   Physical Exam  General appearance: Well nourished, well developed, and well hydrated. In no apparent acute distress Mental status: Alert, oriented x 3 (person, place, & time)       Respiratory: No evidence of acute respiratory distress Eyes: PERLA Vitals: BP (!) 143/79   Pulse 84   Temp (!) 97.2 F (36.2 C) (Temporal)   Resp 16    Ht 5' 6"  (1.676 m)   Wt 266 lb (120.7 kg)   SpO2 100%   BMI 42.93 kg/m  BMI: Estimated body mass index is 42.93 kg/m as calculated from the following:   Height as of this encounter: 5' 6"  (1.676 m).   Weight as of this encounter: 266 lb (120.7 kg). Ideal: Ideal body weight: 59.3 kg (130 lb 11.7 oz) Adjusted ideal body weight: 83.8 kg (184 lb 13.4 oz)    Lumbar Spine Area Exam  Skin & Axial Inspection: No masses, redness, or swelling Alignment: Symmetrical Functional ROM: Pain restricted ROM affecting both sides Stability: No instability detected Muscle Tone/Strength: Functionally intact. No obvious neuro-muscular anomalies detected. Sensory (Neurological): Dermatomal pain pattern and neurogenic   Provocative Tests: Hyperextension/rotation test: (+) bilaterally for facet joint pain.   Lumbar quadrant test (Kemp's test): (+) bilateral for foraminal stenosis. Improved after LESI   Gait & Posture Assessment  Ambulation: Unassisted Gait: Modified gait pattern (slower gait speed, wider stride width, and longer stance duration) associated with morbid obesity Posture: Difficulty standing up straight, due to pain   Lower Extremity Exam      Side: Right lower extremity   Side: Left lower extremity  Stability: No instability observed           Stability: No instability observed          Skin & Extremity Inspection: Skin color, temperature, and hair growth are WNL. No peripheral edema or cyanosis. No masses, redness, swelling, asymmetry, or associated skin lesions. No contractures.   Skin & Extremity Inspection: Skin color, temperature, and hair growth are WNL. No peripheral edema or cyanosis. No masses, redness, swelling, asymmetry, or associated skin lesions. No contractures.  Functional ROM: Pain restricted ROM for all joints of the lower extremity           Functional ROM: Pain restricted ROM for all joints of the lower extremity          Muscle Tone/Strength: Functionally intact. No  obvious neuro-muscular anomalies detected.   Muscle Tone/Strength: Functionally intact. No obvious neuro-muscular anomalies detected.  Sensory (Neurological): Neurogenic pain pattern         Sensory (Neurological): Neurogenic pain pattern        DTR: Patellar: deferred today Achilles: deferred today Plantar: deferred today   DTR: Patellar: deferred today Achilles: deferred today Plantar: deferred today  Palpation: No palpable anomalies   Palpation: No palpable anomalies     Assessment  Diagnosis Status  1. Spinal stenosis, lumbar region, with neurogenic claudication   2. Pain management contract signed   3. Chronic radicular lumbar pain   4. Chronic pain syndrome    Responding Controlled Controlled   Updated Problems: Problem  Pain Management Contract Signed  Spinal Stenosis, Lumbar Region, With Neurogenic Claudication  Lumbar Spondylosis  Lumbar Degenerative Disc Disease  Sacroiliac Joint Pain  Chronic Pain Syndrome  Chronic Radicular Lumbar Pain     Plan of Care   [name] has a current medication list which includes the following [place]-term medication(s): duloxetine, furosemide, and lisinopril-hydrochlorothiazide.  Pharmacotherapy (Medications Ordered): Meds ordered this encounter  Medications   traMADol (ULTRAM) 50 MG tablet    Sig: Take 1 tablet (50 mg total) by mouth daily as needed for severe pain.    Dispense:  30 tablet    Refill:  1   DULoxetine (CYMBALTA) 20 MG capsule    Sig: Take 1 capsule (20 mg total) by mouth daily for 30 days, THEN 2 capsules (40 mg total) daily.    Dispense:  100 capsule    Refill:  0   Pain contract signed Discontinue Lyrica as patient states that it is not effective.  She has also tried gabapentin in the past with limited benefit.  Trial of Cymbalta as above. Prescription for tramadol as above Consider repeating lumbar epidural steroid injection for lumbar radicular pain, lumbar spinal stenosis with neurogenic  claudication Consider lumbar facet medial branch nerve blocks for lumbar facet arthropathy, lumbar spondylosis.  Follow-up plan:   Return in about 8 weeks (around 04/17/2022) for Medication Management, in person.     L4-5 ESI 01/29/2022    Recent Visits Date Type Provider Dept  01/29/22 Procedure visit Gillis Santa, MD [facility]  01/14/22 Office Visit Gillis Santa, MD [facility]  Showing recent visits within past 90 days and meeting all other requirements Today's Visits Date Type Provider Dept  02/19/22 Office Visit Gillis Santa, MD [facility]  Showing today's visits and meeting all other requirements Future Appointments Date Type Provider Dept  04/14/22 Appointment Gillis Santa, MD [facility]  Showing future appointments within next 90 days and meeting all other requirements  I discussed the assessment and treatment plan with the patient. The patient was provided an opportunity to ask questions and all were answered. The patient agreed with the plan and demonstrated an understanding of the instructions.  Patient advised to call back or seek an in-person evaluation if the symptoms or condition worsens.  Duration of encounter: 35mnutes.  Total time on encounter, as per AMA guidelines included both the face-to-face and non-face-to-face time personally spent by the physician and/or other qualified health care professional(s) on the day of the encounter (includes time in activities that require the physician or other qualified health care professional and does not include time in activities normally performed by clinical staff). Physician's time may include the following activities when performed: preparing to see the patient (eg, review of tests, pre-charting review of records) obtaining and/or reviewing separately obtained history performing a medically appropriate examination and/or evaluation counseling and educating the  patient/family/caregiver ordering medications, tests, or procedures referring and communicating with other health care professionals (when not separately reported) documenting clinical information in the electronic or other health record independently interpreting results (not separately reported) and communicating results to the patient/ family/caregiver care coordination (not separately reported)  Note by: BGillis Santa MD Date: 02/19/2022; Time: 2:26 PM

## 2022-04-14 ENCOUNTER — Encounter: Payer: Self-pay | Admitting: Student in an Organized Health Care Education/Training Program

## 2022-04-14 ENCOUNTER — Ambulatory Visit
Payer: Medicare Other | Attending: Student in an Organized Health Care Education/Training Program | Admitting: Student in an Organized Health Care Education/Training Program

## 2022-04-14 VITALS — BP 157/102 | HR 80 | Temp 97.2°F | Resp 18 | Ht 67.0 in | Wt 240.0 lb

## 2022-04-14 DIAGNOSIS — G894 Chronic pain syndrome: Secondary | ICD-10-CM

## 2022-04-14 DIAGNOSIS — G8929 Other chronic pain: Secondary | ICD-10-CM

## 2022-04-14 DIAGNOSIS — M47816 Spondylosis without myelopathy or radiculopathy, lumbar region: Secondary | ICD-10-CM

## 2022-04-14 DIAGNOSIS — M48062 Spinal stenosis, lumbar region with neurogenic claudication: Secondary | ICD-10-CM | POA: Diagnosis not present

## 2022-04-14 DIAGNOSIS — M5416 Radiculopathy, lumbar region: Secondary | ICD-10-CM | POA: Diagnosis not present

## 2022-04-14 DIAGNOSIS — Z0289 Encounter for other administrative examinations: Secondary | ICD-10-CM | POA: Diagnosis not present

## 2022-04-14 MED ORDER — TRAMADOL HCL 50 MG PO TABS: 50.0000 mg | ORAL_TABLET | Freq: Two times a day (BID) | ORAL | 2 refills | Status: DC | PRN

## 2022-04-14 MED ORDER — DULOXETINE HCL 60 MG PO CPEP: 60.0000 mg | ORAL_CAPSULE | Freq: Every day | ORAL | 2 refills | Status: DC

## 2022-04-14 NOTE — Progress Notes (Signed)
PROVIDER NOTE: Information contained herein reflects review and annotations entered in association with encounter. Interpretation of such information and data should be left to medically-trained personnel. Information provided to patient can be located elsewhere in the medical record under "Patient Instructions". Document created using STT-dictation technology, any transcriptional errors that may result from process are unintentional.    Patient: Heather Hartman  Service Category: E/M  Provider: Gillis Santa, MD  DOB: Jan 25, 1951  DOS: 04/14/2022  Referring Provider: Gladstone Lighter, MD  MRN: [id number]  Specialty: Interventional Pain Management  PCP: Gladstone Lighter, MD  Type: Established Patient  Setting: Ambulatory outpatient    Location: Office  Delivery: Face-to-face     HPI  Heather Hartman, a 71 y.o. year old female, is here today because of her Spinal stenosis, lumbar region, with neurogenic claudication [M48.062]. Heather Hartman primary complain today is Hip Pain (Bilateral ) Last encounter: My last encounter with her was on 02/19/2022. Pertinent problems: [name] has Chronic radicular lumbar pain; Spinal stenosis, lumbar region, with neurogenic claudication; Lumbar spondylosis; Lumbar degenerative disc disease; Sacroiliac joint pain; Chronic pain syndrome; and Pain management contract signed on their pertinent problem list. Pain Assessment: Severity of Chronic pain is reported as a 7 /10. Location: Hip Right, Left/down both legs to the bottom of the feet. Onset: More than a month ago. Quality: Discomfort, Constant, Heaviness. Timing: Constant. Modifying factor(s): tramadol, excedrin eases off the pain a little bit. Vitals:  height is _0  (1.702 m) and weight is 240 lb (108.9 kg). Her temporal temperature is 97.2 F (36.2 C) (abnormal). Her blood pressure is 157/102 (abnormal) and her pulse is 80. Her respiration is 18 and oxygen saturation is 99%.  BMI: Estimated body mass index is  37.59 kg/m as calculated from the following:   Height as of this encounter: _1  (1.702 m).   Weight as of this encounter: 240 lb (108.9 kg).  Reason for encounter: medication management.   Patient is currently taking Cymbalta 40 mg daily and tramadol 50 mg daily as needed.  She does endorse analgesic benefit but not as much as she had hoped.  No side effects with current medications.  Recommend increasing Cymbalta to 60 mg and will increase tramadol to 50 mg twice daily as needed.  Patient counseled on risk of serotonin syndrome and associated symptoms.  Follow-up in 2 months.  Pharmacotherapy Assessment  Analgesic: Tramadol 50 mg twice daily as needed  Monitoring: [place] PMP: PDMP reviewed during this encounter.       Pharmacotherapy: No side-effects or adverse reactions reported. Compliance: No problems identified. Effectiveness: Clinically acceptable.  Janett Billow, RN  04/14/2022  1:28 PM  Sign when Signing Visit Nursing Pain Medication Assessment:  Safety precautions to be maintained throughout the outpatient stay will include: orient to surroundings, keep bed in low position, maintain call bell within reach at all times, provide assistance with transfer out of bed and ambulation.  Medication Inspection Compliance: Pill count conducted under aseptic conditions, in front of the patient. Neither the pills nor the bottle was removed from the patient's sight at any time. Once count was completed pills were immediately returned to the patient in their original bottle.  Medication: Tramadol (Ultram) Pill/Patch Count:  5 of 30 pills remain Pill/Patch Appearance: Markings consistent with prescribed medication Bottle Appearance: Standard pharmacy container. Clearly labeled. Filled Date: 69 / 01 / 2023 Last Medication intake:  Today    No results found for: "CBDTHCR" No results found for: "D8THCCBX"  No results found for: "D9THCCBX"  UDS:  Summary  Date Value Ref Range Status   01/14/2022 Note  Final    Comment:    ==================================================================== Compliance Drug Analysis, Ur ==================================================================== Test                             Result       Flag       Units  Drug Present and Declared for Prescription Verification   Tramadol                       >1976        EXPECTED   ng/mg creat   O-Desmethyltramadol            >1976        EXPECTED   ng/mg creat   N-Desmethyltramadol            769          EXPECTED   ng/mg creat    Source of tramadol is a prescription medication. O-desmethyltramadol    and N-desmethyltramadol are expected metabolites of tramadol.    Acetaminophen                  PRESENT      EXPECTED  Drug Present not Declared for Prescription Verification   Naproxen                       PRESENT      UNEXPECTED   Diphenhydramine                PRESENT      UNEXPECTED  Drug Absent but Declared for Prescription Verification   Pregabalin                     Not Detected UNEXPECTED ==================================================================== Test                      Result    Flag   Units      Ref Range   Creatinine              253              mg/dL      >=20 ==================================================================== Declared Medications:  The flagging and interpretation on this report are based on the  following declared medications.  Unexpected results may arise from  inaccuracies in the declared medications.   **Note: The testing scope of this panel includes these medications:   Pregabalin (Lyrica)  Tramadol (Ultram)   **Note: The testing scope of this panel does not include small to  moderate amounts of these reported medications:   Acetaminophen (Tylenol)   **Note: The testing scope of this panel does not include the  following reported medications:   Furosemide (Lasix)  Hydrochlorothiazide (Zestoretic)  Lisinopril (Zestoretic)   Mouthwash  Potassium Chloride ==================================================================== For clinical consultation, please call [phone]. ====================================================================       ROS  Constitutional: Denies any fever or chills Gastrointestinal: No reported hemesis, hematochezia, vomiting, or acute GI distress Musculoskeletal:  LSS- limited walking ability Neurological: No reported episodes of acute onset apraxia, aphasia, dysarthria, agnosia, amnesia, paralysis, loss of coordination, or loss of consciousness  Medication Review  DULoxetine, acetaminophen, clotrimazole-betamethasone, furosemide, lisinopril-hydrochlorothiazide, and traMADol  History Review  Allergy: Ms. Segall is allergic to vioxx [rofecoxib] and atorvastatin. Drug: Ms. Croll  has  no history on file for drug use. Alcohol:  has no history on file for alcohol use. Tobacco:  reports that she has been smoking cigarettes. She has a 30.00 pack-year smoking history. She has never used smokeless tobacco. Social: Ms. Forgey  reports that she has been smoking cigarettes. She has a 30.00 pack-year smoking history. She has never used smokeless tobacco. Medical:  has a past medical history of Anxiety, Depression, GERD (gastroesophageal reflux disease), Hyperlipidemia, and Hypertension. Surgical: Ms. Bays  has no past surgical history on file. Family: family history is not on file.  Laboratory Chemistry Profile   Renal No results found for: "BUN", "CREATININE", "LABCREA", "BCR", "GFR", "GFRAA", "GFRNONAA", "LABVMA", "EPIRU", "EPINEPH24HUR", "NOREPRU", "NOREPI24HUR", "DOPARU", "DOPAM24HRUR"  Hepatic No results found for: "AST", "ALT", "ALBUMIN", "ALKPHOS", "HCVAB", "AMYLASE", "LIPASE", "AMMONIA"  Electrolytes No results found for: "NA", "K", "CL", "CALCIUM", "MG", "PHOS"  Bone No results found for: "VD25OH", "VD125OH2TOT", "KD3267TI4", "PY0998PJ8", "25OHVITD1", "25OHVITD2", "25OHVITD3",  "TESTOFREE", "TESTOSTERONE"  Inflammation (CRP: Acute Phase) (ESR: Chronic Phase) No results found for: "CRP", "ESRSEDRATE", "LATICACIDVEN"       Note: Above Lab results reviewed.   Physical Exam  General appearance: Well nourished, well developed, and well hydrated. In no apparent acute distress Mental status: Alert, oriented x 3 (person, place, & time)       Respiratory: No evidence of acute respiratory distress Eyes: PERLA Vitals: BP (!) 157/102 (BP Location: Right Arm, Patient Position: Sitting, Cuff Size: Normal) Comment: the patient has not had her blood pressure meds yet today.  Pulse 80   Temp (!) 97.2 F (36.2 C) (Temporal)   Resp 18   Ht _0  (1.702 m)   Wt 240 lb (108.9 kg)   SpO2 99%   BMI 37.59 kg/m  BMI: Estimated body mass index is 37.59 kg/m as calculated from the following:   Height as of this encounter: _1  (1.702 m).   Weight as of this encounter: 240 lb (108.9 kg). Ideal: Ideal body weight: 61.6 kg (135 lb 12.9 oz) Adjusted ideal body weight: 80.5 kg (177 lb 7.7 oz)  Assessment   Diagnosis Status  1. Spinal stenosis, lumbar region, with neurogenic claudication   2. Pain management contract signed   3. Chronic radicular lumbar pain   4. Lumbar facet arthropathy   5. Lumbar spondylosis   6. Chronic pain syndrome    Persistent Controlled Controlled     Plan of Care    Ms. Diana Davenport Zane has a current medication list which includes the following Mall-term medication(s): duloxetine, furosemide, and lisinopril-hydrochlorothiazide.  Pharmacotherapy (Medications Ordered): Meds ordered this encounter  Medications   traMADol (ULTRAM) 50 MG tablet    Sig: Take 1 tablet (50 mg total) by mouth every 12 (twelve) hours as needed for severe pain.    Dispense:  60 tablet    Refill:  2   DULoxetine (CYMBALTA) 60 MG capsule    Sig: Take 1 capsule (60 mg total) by mouth daily.    Dispense:  30 capsule    Refill:  2   Consider MILD for LSS +  [place]  Orders:  No orders of the defined types were placed in this encounter.  Follow-up plan:   Return in about 3 months (around 07/14/2022) for Medication Management, in person.     L4-5 ESI 01/29/2022     Recent Visits Date Type Provider Dept  02/19/22 Office Visit Gillis Santa, MD [facility]  01/29/22 Procedure visit Gillis Santa, [facility]  01/14/22  Office Visit Gillis Santa, MD [facility]  Showing recent visits within past 90 days and meeting all other requirements Today's Visits Date Type Provider Dept  04/14/22 Office Visit Gillis Santa, MD [facility]  Showing today's visits and meeting all other requirements Future Appointments Date Type Provider Dept  07/10/22 Appointment Gillis Santa, MD [facility]  Showing future appointments within next 90 days and meeting all other requirements  I discussed the assessment and treatment plan with the patient. The patient was provided an opportunity to ask questions and all were answered. The patient agreed with the plan and demonstrated an understanding of the instructions.  Patient advised to call back or seek an in-person evaluation if the symptoms or condition worsens.  Duration of encounter: 26mnutes.  Total time on encounter, as per AMA guidelines included both the face-to-face and non-face-to-face time personally spent by the physician and/or other qualified health care professional(s) on the day of the encounter (includes time in activities that require the physician or other qualified health care professional and does not include time in activities normally performed by clinical staff). Physician's time may include the following activities when performed: preparing to see the patient (eg, review of tests, pre-charting review of records) obtaining and/or reviewing separately obtained history performing a medically appropriate examination and/or evaluation counseling and  educating the patient/family/caregiver ordering medications, tests, or procedures referring and communicating with other health care professionals (when not separately reported) documenting clinical information in the electronic or other health record independently interpreting results (not separately reported) and communicating results to the patient/ family/caregiver care coordination (not separately reported)  Note by: BGillis Santa MD Date: 04/14/2022; Time: 2:47 PM

## 2022-04-14 NOTE — Progress Notes (Signed)
Nursing Pain Medication Assessment:  Safety precautions to be maintained throughout the outpatient stay will include: orient to surroundings, keep bed in low position, maintain call bell within reach at all times, provide assistance with transfer out of bed and ambulation.  Medication Inspection Compliance: Pill count conducted under aseptic conditions, in front of the patient. Neither the pills nor the bottle was removed from the patient's sight at any time. Once count was completed pills were immediately returned to the patient in their original bottle.  Medication: Tramadol (Ultram) Pill/Patch Count:  5 of 30 pills remain Pill/Patch Appearance: Markings consistent with prescribed medication Bottle Appearance: Standard pharmacy container. Clearly labeled. Filled Date: 57 / 01 / 2023 Last Medication intake:  Today

## 2022-06-06 ENCOUNTER — Telehealth: Payer: Self-pay | Admitting: Student in an Organized Health Care Education/Training Program

## 2022-06-06 NOTE — Telephone Encounter (Signed)
PT stated that she has moved and need her prescriptions send to [facility] in [place]. Please give patient a call if you have any questions.TY

## 2022-06-06 NOTE — Telephone Encounter (Signed)
Attempted to return call.  Patient has a voicemail that has not been set up yet.  Unable to leave a message.

## 2022-07-10 ENCOUNTER — Ambulatory Visit
Payer: Medicare Other | Attending: Student in an Organized Health Care Education/Training Program | Admitting: Student in an Organized Health Care Education/Training Program

## 2022-07-10 ENCOUNTER — Encounter: Payer: Self-pay | Admitting: Student in an Organized Health Care Education/Training Program

## 2022-07-10 VITALS — BP 132/100 | HR 95 | Temp 97.8°F | Resp 16 | Ht 66.0 in | Wt 252.0 lb

## 2022-07-10 DIAGNOSIS — M5136 Other intervertebral disc degeneration, lumbar region: Secondary | ICD-10-CM | POA: Insufficient documentation

## 2022-07-10 DIAGNOSIS — M533 Sacrococcygeal disorders, not elsewhere classified: Secondary | ICD-10-CM | POA: Diagnosis present

## 2022-07-10 DIAGNOSIS — M47816 Spondylosis without myelopathy or radiculopathy, lumbar region: Secondary | ICD-10-CM | POA: Insufficient documentation

## 2022-07-10 DIAGNOSIS — M48062 Spinal stenosis, lumbar region with neurogenic claudication: Secondary | ICD-10-CM | POA: Insufficient documentation

## 2022-07-10 MED ORDER — TRAMADOL HCL ER 200 MG PO TB24: 200.0000 mg | ORAL_TABLET | Freq: Every day | ORAL | 2 refills | Status: DC

## 2022-07-10 MED ORDER — DULOXETINE HCL 60 MG PO CPEP: 60.0000 mg | ORAL_CAPSULE | Freq: Every day | ORAL | 2 refills | Status: AC

## 2022-07-10 NOTE — Progress Notes (Signed)
Nursing Pain Medication Assessment:  Safety precautions to be maintained throughout the outpatient stay will include: orient to surroundings, keep bed in low position, maintain call bell within reach at all times, provide assistance with transfer out of bed and ambulation.  Medication Inspection Compliance: Heather Hartman did not comply with our request to bring her pills to be counted. She was reminded that bringing the medication bottles, even when empty, is a requirement.  Medication: None brought in. Pill/Patch Count: None available to be counted. Bottle Appearance: No container available. Did not bring bottle(s) to appointment. Filled Date: N/A Last Medication intake:  YesterdaySafety precautions to be maintained throughout the outpatient stay will include: orient to surroundings, keep bed in low position, maintain call bell within reach at all times, provide assistance with transfer out of bed and ambulation.

## 2022-07-10 NOTE — Progress Notes (Signed)
PROVIDER NOTE: Information contained herein reflects review and annotations entered in association with encounter. Interpretation of such information and data should be left to medically-trained personnel. Information provided to patient can be located elsewhere in the medical record under "Patient Instructions". Document created using STT-dictation technology, any transcriptional errors that may result from process are unintentional.    Patient: Heather Hartman  Service Category: E/M  Provider: Gillis Santa, MD  DOB: 1951-01-18  DOS: 07/10/2022  Referring Provider: Gladstone Lighter, MD  MRN: [id number]  Specialty: Interventional Pain Management  PCP: Gladstone Lighter, MD  Type: Established Patient  Setting: Ambulatory outpatient    Location: Office  Delivery: Face-to-face     HPI  [name], a 71 y.o. year old female, is here today because of her Spinal stenosis, lumbar region, with neurogenic claudication [M48.062]. [name] primary complain today is Hip Pain (bilateral) Last encounter: My last encounter with her was on 04/14/2022 Pertinent problems: Heather Hartman has Chronic radicular lumbar pain; Spinal stenosis, lumbar region, with neurogenic claudication; Lumbar spondylosis; Lumbar degenerative disc disease; Sacroiliac joint pain; Chronic pain syndrome; and Pain management contract signed on their pertinent problem list. Pain Assessment: Severity of Chronic pain is reported as a 8 /10. Location: Hip Right, Left/both legs to the feet, with occasional numbness in feet. Onset: More than a month ago. Quality: Constant. Timing: Constant. Modifying factor(s): nothing. Vitals:  height is '5\' 6"'$  (1.676 m) and weight is 252 lb (114.3 kg). Her temporal temperature is 97.8 F (36.6 C). Her blood pressure is 132/100 (abnormal) and her pulse is 95. Her respiration is 16 and oxygen saturation is 99%.  BMI: Estimated body mass index is 40.67 kg/m as calculated from the following:   Height as of this  encounter: '5\' 6"'$  (1.676 m).   Weight as of this encounter: 252 lb (114.3 kg).  Reason for encounter: medication management.   Patient presents today for medication management.  Her Cymbalta was increased from 40 to 60 mg her last visit.  No side effects with current dose and patient reports some benefit with it.  She states that the tramadol at 50 mg twice a day is not very helpful.  We discussed transitioning to extended release tramadol at 200 mg to see if that provides better analgesic response.  Pharmacotherapy Assessment  Analgesic: Tramadol 200 mg ER daily  Monitoring: [place] PMP: PDMP reviewed during this encounter.       Pharmacotherapy: No side-effects or adverse reactions reported. Compliance: No problems identified. Effectiveness: Clinically acceptable.  Landis Martins, RN  07/10/2022  1:45 PM  Sign when Signing Visit Nursing Pain Medication Assessment:  Safety precautions to be maintained throughout the outpatient stay will include: orient to surroundings, keep bed in low position, maintain call bell within reach at all times, provide assistance with transfer out of bed and ambulation.  Medication Inspection Compliance: Heather Hartman did not comply with our request to bring her pills to be counted. She was reminded that bringing the medication bottles, even when empty, is a requirement.  Medication: None brought in. Pill/Patch Count: None available to be counted. Bottle Appearance: No container available. Did not bring bottle(s) to appointment. Filled Date: N/A Last Medication intake:  YesterdaySafety precautions to be maintained throughout the outpatient stay will include: orient to surroundings, keep bed in low position, maintain call bell within reach at all times, provide assistance with transfer out of bed and ambulation.     No results found for: "CBDTHCR" No results found  for: "D8THCCBX" No results found for: "D9THCCBX"  UDS:  Summary  Date Value Ref Range Status   01/14/2022 Note  Final    Comment:    ==================================================================== Compliance Drug Analysis, Ur ==================================================================== Test                             Result       Flag       Units  Drug Present and Declared for Prescription Verification   Tramadol                       >1976        EXPECTED   ng/mg creat   O-Desmethyltramadol            >1976        EXPECTED   ng/mg creat   N-Desmethyltramadol            769          EXPECTED   ng/mg creat    Source of tramadol is a prescription medication. O-desmethyltramadol    and N-desmethyltramadol are expected metabolites of tramadol.    Acetaminophen                  PRESENT      EXPECTED  Drug Present not Declared for Prescription Verification   Naproxen                       PRESENT      UNEXPECTED   Diphenhydramine                PRESENT      UNEXPECTED  Drug Absent but Declared for Prescription Verification   Pregabalin                     Not Detected UNEXPECTED ==================================================================== Test                      Result    Flag   Units      Ref Range   Creatinine              253              mg/dL      >=20 ==================================================================== Declared Medications:  The flagging and interpretation on this report are based on the  following declared medications.  Unexpected results may arise from  inaccuracies in the declared medications.   **Note: The testing scope of this panel includes these medications:   Pregabalin (Lyrica)  Tramadol (Ultram)   **Note: The testing scope of this panel does not include small to  moderate amounts of these reported medications:   Acetaminophen (Tylenol)   **Note: The testing scope of this panel does not include the  following reported medications:   Furosemide (Lasix)  Hydrochlorothiazide (Zestoretic)  Lisinopril (Zestoretic)   Mouthwash  Potassium Chloride ==================================================================== For clinical consultation, please call [phone]. ====================================================================       ROS  Constitutional: Denies any fever or chills Gastrointestinal: No reported hemesis, hematochezia, vomiting, or acute GI distress Musculoskeletal:  LSS- limited walking ability Neurological: No reported episodes of acute onset apraxia, aphasia, dysarthria, agnosia, amnesia, paralysis, loss of coordination, or loss of consciousness  Medication Review  DULoxetine, acetaminophen, clotrimazole-betamethasone, furosemide, lisinopril-hydrochlorothiazide, and traMADol  History Review  Allergy: Heather Hartman is allergic to vioxx [rofecoxib] and atorvastatin. Drug: Heather Hartman  has no history on file for drug use. Alcohol:  has no history on file for alcohol use. Tobacco:  reports that she has been smoking cigarettes. She has a 30.00 pack-year smoking history. She has never used smokeless tobacco. Social: Heather Hartman  reports that she has been smoking cigarettes. She has a 30.00 pack-year smoking history. She has never used smokeless tobacco. Medical:  has a past medical history of Anxiety, Depression, GERD (gastroesophageal reflux disease), Hyperlipidemia, and Hypertension. Surgical: Heather Hartman  has no past surgical history on file. Family: family history is not on file.  Laboratory Chemistry Profile   Renal No results found for: "BUN", "CREATININE", "LABCREA", "BCR", "GFR", "GFRAA", "GFRNONAA", "LABVMA", "EPIRU", "EPINEPH24HUR", "NOREPRU", "NOREPI24HUR", "DOPARU", "DOPAM24HRUR"  Hepatic No results found for: "AST", "ALT", "ALBUMIN", "ALKPHOS", "HCVAB", "AMYLASE", "LIPASE", "AMMONIA"  Electrolytes No results found for: "NA", "K", "CL", "CALCIUM", "MG", "PHOS"  Bone No results found for: "VD25OH", "VD125OH2TOT", "[id number]", "[id number]", "25OHVITD1", "25OHVITD2", "25OHVITD3",  "TESTOFREE", "TESTOSTERONE"  Inflammation (CRP: Acute Phase) (ESR: Chronic Phase) No results found for: "CRP", "ESRSEDRATE", "LATICACIDVEN"       Note: Above Lab results reviewed.   CLINICAL DATA:  Low back pain, bilateral lower extremity radiculopathy   EXAM: MRI LUMBAR SPINE WITHOUT CONTRAST   TECHNIQUE: Multiplanar, multisequence MR imaging of the lumbar spine was performed. No intravenous contrast was administered.   COMPARISON:  05/28/2018   FINDINGS: Segmentation:  Standard.   Alignment: Stable including trace anterolisthesis at L3-L4 and mild anterolisthesis at L4-L5.   Vertebrae: Stable vertebral body heights. No substantial marrow edema. No suspicious osseous lesion.   Conus medullaris and cauda equina: Conus extends to the L1-L2 level. Conus appears normal. There is some redundancy of the cauda equina secondary to L4-L5 canal stenosis.   Paraspinal and other soft tissues: Unremarkable.   Disc levels:   L1-L2: Minimal disc bulge with superimposed minimal right paracentral disc protrusion. Mild facet arthropathy. No canal or foraminal stenosis. Appearance is similar.   L2-L3: Mild facet arthropathy. No canal or foraminal stenosis. Appearance is similar.   L3-L4: Anterolisthesis with uncovering disc bulge. Moderate right and marked left facet arthropathy. No canal or foraminal stenosis. Appearance is similar.   L4-L5: Anterolisthesis with uncovering of disc bulge. Marked facet arthropathy with ligamentum flavum infolding. Marked canal stenosis with effacement of subarticular recesses is stable to slightly improved. Moderate to marked right foraminal stenosis is similar. Decreased mild to moderate left foraminal stenosis.   L5-S1: Disc bulge. Moderate facet arthropathy. No canal stenosis. Mild to moderate foraminal stenosis. Appearance is similar.   IMPRESSION: Multilevel degenerative changes as detailed above. As before, changes are greatest at L4-L5.  Marked canal stenosis is stable to slightly improved and there is decreased left foraminal stenosisat this level. Otherwise no substantial change since 2020 examination.     Electronically Signed   By: Macy Mis M.D.   On: 05/05/2020 17:37    Physical Exam  General appearance: Well nourished, well developed, and well hydrated. In no apparent acute distress Mental status: Alert, oriented x 3 (person, place, & time)       Respiratory: No evidence of acute respiratory distress Eyes: PERLA Vitals: BP (!) 132/100   Pulse 95   Temp 97.8 F (36.6 C) (Temporal)   Resp 16   Ht '5\' 6"'$  (1.676 m)   Wt 252 lb (114.3 kg)   SpO2 99%   BMI 40.67 kg/m  BMI: Estimated body mass index is 40.67 kg/m as calculated from the following:   Height as  of this encounter: '5\' 6"'$  (1.676 m).   Weight as of this encounter: 252 lb (114.3 kg). Ideal: Ideal body weight: 59.3 kg (130 lb 11.7 oz) Adjusted ideal body weight: 81.3 kg (179 lb 3.8 oz)  Low back pain, difficulty ambulating Limited range of motion of lumbar spine Bilateral lower extremity edema of bilateral feet  Assessment   Diagnosis Status  1. Spinal stenosis, lumbar region, with neurogenic claudication   2. Lumbar facet arthropathy   3. Lumbar degenerative disc disease   4. Sacroiliac joint pain    Persistent Persistent Controlled     Plan of Care    Heather Hartman has a current medication list which includes the following [place]-term medication(s): furosemide, lisinopril-hydrochlorothiazide, and duloxetine.  Pharmacotherapy (Medications Ordered): Meds ordered this encounter  Medications   traMADol (ULTRAM-ER) 200 MG 24 hr tablet    Sig: Take 1 tablet (200 mg total) by mouth daily.    Dispense:  30 tablet    Refill:  2   DULoxetine (CYMBALTA) 60 MG capsule    Sig: Take 1 capsule (60 mg total) by mouth daily.    Dispense:  30 capsule    Refill:  2   Patient could also be a candidate for mild, minimally invasive lumbar  decompression as she does have hypertrophic ligamentum flavum as noted on her lumbar MRI in 2022 along with central canal and foraminal stenosis.  Recommend repeat lumbar MRI and will discuss results with patient along with her candidacy for MILD procedure.  Orders Placed This Encounter  Procedures   MR LUMBAR SPINE WO CONTRAST    Patient presents with axial pain with possible radicular component. Please assist [place] in identifying specific level(s) and laterality of any additional findings such as: 1. Facet (Zygapophyseal) joint DJD (Hypertrophy, space narrowing, subchondral sclerosis, and/or osteophyte formation) 2. DDD and/or IVDD (Loss of disc height, desiccation, gas patterns, osteophytes, endplate sclerosis, or "Black disc disease") 3. Pars defects 4. Spondylolisthesis, spondylosis, and/or spondyloarthropathies (include Degree/Grade of displacement in mm) (stability) 5. Vertebral body Fractures (acute/chronic) (state percentage of collapse) 6. Demineralization (osteopenia/osteoporotic) 7. Bone pathology 8. Foraminal narrowing  9. Surgical changes 10. Central, Lateral Recess, and/or Foraminal Stenosis (include AP diameter of stenosis in mm) 11. Surgical changes (hardware type, status, and presence of fibrosis) 12. Modic Type Changes (MRI only) 13. IVDD (Disc bulge, protrusion, herniation, extrusion) (Level, laterality, extent)    Standing Status:   Future    Standing Expiration Date:   08/10/2022    Scheduling Instructions:     Please make sure that the patient understands that this needs to be done as soon as possible. Never have the patient do the imaging "just before the next appointment". Inform patient that having the imaging done within the [facility] will expedite the availability of the results and will provide      imaging availability to the requesting physician. In addition inform the patient that the imaging order has an expiration date and will not be renewed if not done within  the active period.    Order Specific Question:   What is the patient's sedation requirement?    Answer:   No Sedation    Order Specific Question:   Does the patient have a pacemaker or implanted devices?    Answer:   No    Order Specific Question:   Preferred imaging location?    Answer:   [facility] (table limit-350lbs)    Order Specific Question:   Call Results- Best  Contact Number?    Answer:   [phone] ([facility])    Order Specific Question:   Radiology Contrast Protocol - do NOT remove file path    Answer:   [email or web address]       Follow-up plan:   Return in about 3 months (around 10/10/2022) for Medication Management, in person.     L4-5 ESI 01/29/2022     Recent Visits Date Type Provider Dept  04/14/22 Office Visit Gillis Santa, MD [facility]  Showing recent visits within past 90 days and meeting all other requirements Today's Visits Date Type Provider Dept  07/10/22 Office Visit Gillis Santa, MD [facility]  Showing today's visits and meeting all other requirements Future Appointments Date Type Provider Dept  10/02/22 Appointment Gillis Santa, MD [facility]  Showing future appointments within next 90 days and meeting all other requirements  I discussed the assessment and treatment plan with the patient. The patient was provided an opportunity to ask questions and all were answered. The patient agreed with the plan and demonstrated an understanding of the instructions.  Patient advised to call back or seek an in-person evaluation if the symptoms or condition worsens.  Duration of encounter: 50mnutes.  Total time on encounter, as per AMA guidelines included both the face-to-face and non-face-to-face time personally spent by the physician and/or other qualified health care professional(s) on the day of the encounter (includes time in activities that require the physician or other  qualified health care professional and does not include time in activities normally performed by clinical staff). Physician's time may include the following activities when performed: preparing to see the patient (eg, review of tests, pre-charting review of records) obtaining and/or reviewing separately obtained history performing a medically appropriate examination and/or evaluation counseling and educating the patient/family/caregiver ordering medications, tests, or procedures referring and communicating with other health care professionals (when not separately reported) documenting clinical information in the electronic or other health record independently interpreting results (not separately reported) and communicating results to the patient/ family/caregiver care coordination (not separately reported)  Note by: BGillis Santa MD Date: 07/10/2022; Time: 3:40 PM

## 2022-07-22 ENCOUNTER — Ambulatory Visit
Admission: RE | Admit: 2022-07-22 | Discharge: 2022-07-22 | Disposition: A | Discharge status: Home / Self Care | Payer: Medicare Other | Source: Ambulatory Visit | Attending: Student in an Organized Health Care Education/Training Program | Admitting: Student in an Organized Health Care Education/Training Program

## 2022-07-22 DIAGNOSIS — M5136 Other intervertebral disc degeneration, lumbar region: Secondary | ICD-10-CM | POA: Diagnosis present

## 2022-07-22 DIAGNOSIS — M48062 Spinal stenosis, lumbar region with neurogenic claudication: Secondary | ICD-10-CM | POA: Diagnosis not present

## 2022-07-22 DIAGNOSIS — M47816 Spondylosis without myelopathy or radiculopathy, lumbar region: Secondary | ICD-10-CM | POA: Diagnosis present

## 2022-07-29 ENCOUNTER — Encounter: Payer: Self-pay | Admitting: Student in an Organized Health Care Education/Training Program

## 2022-07-29 ENCOUNTER — Ambulatory Visit
Admission: RE | Admit: 2022-07-29 | Discharge: 2022-07-29 | Disposition: A | Discharge status: Home / Self Care | Payer: Medicare Other | Source: Ambulatory Visit | Attending: Student in an Organized Health Care Education/Training Program | Admitting: Student in an Organized Health Care Education/Training Program

## 2022-07-29 ENCOUNTER — Ambulatory Visit
Payer: Medicare Other | Attending: Student in an Organized Health Care Education/Training Program | Admitting: Student in an Organized Health Care Education/Training Program

## 2022-07-29 VITALS — BP 146/91 | HR 101 | Temp 97.3°F | Ht 67.0 in | Wt 252.0 lb

## 2022-07-29 DIAGNOSIS — M5136 Other intervertebral disc degeneration, lumbar region: Secondary | ICD-10-CM | POA: Insufficient documentation

## 2022-07-29 DIAGNOSIS — M47816 Spondylosis without myelopathy or radiculopathy, lumbar region: Secondary | ICD-10-CM | POA: Diagnosis present

## 2022-07-29 DIAGNOSIS — M48062 Spinal stenosis, lumbar region with neurogenic claudication: Secondary | ICD-10-CM | POA: Insufficient documentation

## 2022-07-29 DIAGNOSIS — G894 Chronic pain syndrome: Secondary | ICD-10-CM | POA: Diagnosis present

## 2022-07-29 NOTE — Progress Notes (Signed)
Safety precautions to be maintained throughout the outpatient stay will include: orient to surroundings, keep bed in low position, maintain call bell within reach at all times, provide assistance with transfer out of bed and ambulation.  

## 2022-07-29 NOTE — Progress Notes (Signed)
PROVIDER NOTE: Information contained herein reflects review and annotations entered in association with encounter. Interpretation of such information and data should be left to medically-trained personnel. Information provided to patient can be located elsewhere in the medical record under "Patient Instructions". Document created using STT-dictation technology, any transcriptional errors that may result from process are unintentional.    Patient: Heather Hartman  Service Category: E/M  Provider: Gillis Santa, MD  DOB: Nov 29, 1950  DOS: 07/29/2022  Referring Provider: Gladstone Lighter, MD  MRN: [id number]  Specialty: Interventional Pain Management  PCP: Gladstone Lighter, MD  Type: Established Patient  Setting: Ambulatory outpatient    Location: Office  Delivery: Face-to-face     HPI  Heather Hartman, a 72 y.o. year old female, is here today because of her Spinal stenosis, lumbar region, with neurogenic claudication [M48.062]. Heather Hartman primary complain today is Foot Pain (both)  Pertinent problems: Heather Hartman has Chronic radicular lumbar pain; Spinal stenosis, lumbar region, with neurogenic claudication; Lumbar spondylosis; Lumbar degenerative disc disease; Sacroiliac joint pain; Chronic pain syndrome; and Pain management contract signed on their pertinent problem list. Pain Assessment: Severity of Chronic pain is reported as a 10-Worst pain ever/10. Location: Foot Left, Right/radiaities up her leg. Onset: More than a month ago. Quality: Aching, Burning, Constant, Numbness, Throbbing, Tingling, Discomfort. Timing: Constant. Modifying factor(s): meds. Vitals:  height is 5\' 7"  (1.702 m) and weight is 252 lb (114.3 kg). Her temperature is 97.3 F (36.3 C) (abnormal). Her blood pressure is 146/91 (abnormal) and her pulse is 101 (abnormal). Her oxygen saturation is 99%.  BMI: Estimated body mass index is 39.47 kg/m as calculated from the following:   Height as of this encounter: 5\' 7"  (1.702 m).    Weight as of this encounter: 252 lb (114.3 kg). Last encounter: 07/10/2022. Last procedure: 01/29/2022.  Reason for encounter: follow-up evaluation.   To review lumbar MRI.  Patient continues to have persistent low back pain which radiates into her bilateral hips.  She also has difficulty walking an extended period of time and notices numbness and tingling in her legs along with associated weakness with prolonged standing.  She has had a transforaminal lumbar epidural injection with physiatry with limited response as well as an interlaminar epidural steroid injection with me with limited response.  Today we reviewed her lumbar MRI results and discussed the mild procedure at great length and also discussed diagnostic lumbar facet medial branch nerve blocks for her condition.   ROS  Constitutional: Denies any fever or chills Gastrointestinal: No reported hemesis, hematochezia, vomiting, or acute GI distress Musculoskeletal:  Low back pain, bilateral hip pain Neurological: No reported episodes of acute onset apraxia, aphasia, dysarthria, agnosia, amnesia, paralysis, loss of coordination, or loss of consciousness  Medication Review  DULoxetine, acetaminophen, clotrimazole-betamethasone, furosemide, lisinopril-hydrochlorothiazide, and traMADol  History Review  Allergy: Heather Hartman is allergic to vioxx [rofecoxib] and atorvastatin. Drug: Heather Hartman  has no history on file for drug use. Alcohol:  has no history on file for alcohol use. Tobacco:  reports that she has been smoking cigarettes. She has a 30.00 pack-year smoking history. She has never used smokeless tobacco. Social: Heather Hartman  reports that she has been smoking cigarettes. She has a 30.00 pack-year smoking history. She has never used smokeless tobacco. Medical:  has a past medical history of Anxiety, Depression, GERD (gastroesophageal reflux disease), Hyperlipidemia, and Hypertension. Surgical: Heather Hartman  has no past surgical history on  file. Family: family history is not on file.  CLINICAL DATA:  Low back pain, symptoms persist with > 6 wks treatment Lumbar radiculopathy, symptoms persist with > 6 wks treatment lumbar spinal stenosis and Calcium. Bilateral hip pain.   EXAM: MRI LUMBAR SPINE WITHOUT CONTRAST   TECHNIQUE: Multiplanar, multisequence MR imaging of the lumbar spine was performed. No intravenous contrast was administered.   COMPARISON:  Lumbar spine MRI 05/05/2020.   FINDINGS: Segmentation: Conventional numbering is assumed with 5 non-rib-bearing, lumbar type vertebral bodies.   Alignment: Grade 1 anterolisthesis of L3 on L4 and L4 on L5. 3 mm retrolisthesis of L5 on S1.   Vertebrae: Modic type 1 degenerative endplate marrow signal changes at L5-S1.   Conus medullaris and cauda equina: Conus extends to the L1-2 level. Conus and cauda equina appear normal.   Paraspinal and other soft tissues: Infrarenal aortic aneurysm measuring up to at least 4.1 cm (axial image 26 series 8) with involvement of the right common iliac and/or internal iliac arteries (axial image 33 series 8, sagittal image 5 series 5.   Disc levels:   T12-L1: No significant disc herniation, spinal canal stenosis or neural foraminal narrowing. Moderate bilateral facet arthropathy.   L1-L2: Small disc bulge without spinal canal stenosis. Moderate bilateral facet arthropathy without significant neural foraminal narrowing.   L2-L3: No disc herniation, spinal canal stenosis or neural foraminal narrowing. Moderate bilateral facet arthropathy.   L3-L4: Anterolisthesis with uncovered disc and severe bilateral facet arthropathy contribute to mild spinal canal stenosis. No significant neural foraminal narrowing.   L4-L5: Anterolisthesis with uncovered disc and severe bilateral facet arthropathy with joint effusions results in moderate-to-severe spinal canal stenosis, mild left and moderate right neural foraminal narrowing.   L5-S1:  Retrolisthesis and moderate bilateral facet arthropathy with joint effusions results in mild spinal canal stenosis and severe bilateral neural foraminal narrowing.   IMPRESSION: 1. Multilevel lumbar spondylosis and facet arthropathy, worst at L4-5 where there is moderate-to-severe spinal canal stenosis and moderate right neural foraminal narrowing. 2. Severe bilateral neural foraminal narrowing at L5-S1. 3. Infrarenal aortic aneurysm measuring up to at least 4.1 cm with involvement of the right common iliac and/or internal iliac arteries. Recommend follow-up every 12 months and vascular surgery consultation. This recommendation follows ACR consensus guidelines: White Paper of the ACR Incidental Findings Committee II on Vascular Findings. J Am Coll Radiol 2013; 10:789-794.   These results will be called to the ordering clinician or representative by the Radiologist Assistant, and communication documented in the PACS or [employer].    Physical Exam  General appearance: Well nourished, well developed, and well hydrated. In no apparent acute distress Mental status: Alert, oriented x 3 (person, place, & time)       Respiratory: No evidence of acute respiratory distress Eyes: PERLA Vitals: BP (!) 146/91   Pulse (!) 101   Temp (!) 97.3 F (36.3 C)   Ht 5\' 7"  (1.702 m)   Wt 252 lb (114.3 kg)   SpO2 99%   BMI 39.47 kg/m  BMI: Estimated body mass index is 39.47 kg/m as calculated from the following:   Height as of this encounter: 5\' 7"  (1.702 m).   Weight as of this encounter: 252 lb (114.3 kg). Ideal: Ideal body weight: 61.6 kg (135 lb 12.9 oz) Adjusted ideal body weight: 82.7 kg (182 lb 4.5 oz)  Lumbar Spine Area Exam  Skin & Axial Inspection: No masses, redness, or swelling Alignment: Symmetrical Functional ROM: Pain restricted ROM       Stability: No instability detected Muscle Tone/Strength:  Functionally intact. No obvious neuro-muscular anomalies detected. Sensory  (Neurological): Musculoskeletal pain pattern Palpation: Complains of area being tender to palpation       Provocative Tests: Hyperextension/rotation test: (+) bilaterally for facet joint pain. Lumbar quadrant test (Kemp's test): (+) bilaterally for facet joint pain.  Gait & Posture Assessment  Ambulation: Limited Gait: Antalgic gait (limping) Posture: Difficulty standing up straight, due to pain  Lower Extremity Exam    Side: Right lower extremity  Side: Left lower extremity  Stability: No instability observed          Stability: No instability observed          Skin & Extremity Inspection: Skin color, temperature, and hair growth are WNL. No peripheral edema or cyanosis. No masses, redness, swelling, asymmetry, or associated skin lesions. No contractures.  Skin & Extremity Inspection: Skin color, temperature, and hair growth are WNL. No peripheral edema or cyanosis. No masses, redness, swelling, asymmetry, or associated skin lesions. No contractures.  Functional ROM: Unrestricted ROM                  Functional ROM: Unrestricted ROM                  Muscle Tone/Strength: Functionally intact. No obvious neuro-muscular anomalies detected.  Muscle Tone/Strength: Functionally intact. No obvious neuro-muscular anomalies detected.  Sensory (Neurological): Neurogenic pain pattern        Sensory (Neurological): Neurogenic pain pattern        DTR: Patellar: deferred today Achilles: deferred today Plantar: deferred today  DTR: Patellar: deferred today Achilles: deferred today Plantar: deferred today  Palpation: No palpable anomalies  Palpation: No palpable anomalies    Assessment   Diagnosis Status  1. Spinal stenosis, lumbar region, with neurogenic claudication   2. Lumbar facet arthropathy   3. Lumbar degenerative disc disease   4. Chronic pain syndrome    Controlled Controlled Controlled   Updated Problems: No problems updated.  [place] has multifactorial lumbar  spine pathology including L3-L4, L4-L5 anterolisthesis, L5-S1 retrolisthesis.  She has severe bilateral facet arthropathy at L3-L4, L4-L5 along with L5-S1.  She also has severe spinal canal stenosis at L4-L5 with hypertrophic ligamentum flavum.  She has exhausted conservative treatments including neuromodulators such as gabapentin, Lyrica.  She has tried physical therapy, she is also had epidural steroid injections with limited response.  We discussed minimally invasive lumbar decompression as well as diagnostic lumbar facet medial branch nerve blocks.  Patient prefers to start with diagnostic lumbar facet medial branch nerve blocks to address her axial low back pain as well as her bilateral hip pain.  We may also need to consider the mild procedure thereafter to help with her walking distance and leg pain.  Heather Hartman has a history of greater than 3 months of moderate to severe pain which is resulted in functional impairment.  The patient has tried various conservative therapeutic options such as NSAIDs, Tylenol, muscle relaxants, physical therapy which was inadequately effective.  Patient's pain is predominantly axial with physical exam & L-MRI findings suggestive of facet arthropathy. Lumbar facet medial branch nerve blocks were discussed with the patient.  Risks and benefits were reviewed.  Patient would like to proceed with bilateral L2, L3, L4, L5 medial branch nerve block.  Orders:  Orders Placed This Encounter  Procedures   LUMBAR FACET(MEDIAL BRANCH NERVE BLOCK) MBNB    Standing Status:   Future    Standing Expiration Date:   10/28/2022  Scheduling Instructions:     Procedure: Lumbar facet block (AKA.: Lumbosacral medial branch nerve block)     Side: Bilateral     Level: L2-3, L3-4, L4-5, Facets ( L2, L3, L4, L5, Medial Branch)     Sedation: IV Versed     Timeframe: ASAA    Order Specific Question:   Where will this procedure be performed?    Answer:   [facility]   DG PAIN  CLINIC C-ARM 1-60 MIN NO REPORT    Intraoperative interpretation by procedural physician at [place].    Standing Status:   Standing    Number of Occurrences:   1    Order Specific Question:   Reason for exam:    Answer:   Assistance in needle guidance and placement for procedures requiring needle placement in or near specific anatomical locations not easily accessible without such assistance.   Follow-up plan:   Return in about 6 weeks (around 09/09/2022) for B/L L3, 4, 5 MBNB , in clinic NS.      L4-5 ESI 01/29/2022       Recent Visits Date Type Provider Dept  07/10/22 Office Visit Gillis Santa, MD [facility]  Showing recent visits within past 90 days and meeting all other requirements Today's Visits Date Type Provider Dept  07/29/22 Office Visit Gillis Santa, MD [facility]  Showing today's visits and meeting all other requirements Future Appointments Date Type Provider Dept  10/02/22 Appointment Gillis Santa, MD [facility]  Showing future appointments within next 90 days and meeting all other requirements  I discussed the assessment and treatment plan with the patient. The patient was provided an opportunity to ask questions and all were answered. The patient agreed with the plan and demonstrated an understanding of the instructions.  Patient advised to call back or seek an in-person evaluation if the symptoms or condition worsens.  Duration of encounter: 62minutes.  Total time on encounter, as per AMA guidelines included both the face-to-face and non-face-to-face time personally spent by the physician and/or other qualified health care professional(s) on the day of the encounter (includes time in activities that require the physician or other qualified health care professional and does not include time in activities normally performed by clinical staff). Physician's time may include the following activities when  performed: Preparing to see the patient (e.g., pre-charting review of records, searching for previously ordered imaging, lab work, and nerve conduction tests) Review of prior analgesic pharmacotherapies. Reviewing PMP Interpreting ordered tests (e.g., lab work, imaging, nerve conduction tests) Performing post-procedure evaluations, including interpretation of diagnostic procedures Obtaining and/or reviewing separately obtained history Performing a medically appropriate examination and/or evaluation Counseling and educating the patient/family/caregiver Ordering medications, tests, or procedures Referring and communicating with other health care professionals (when not separately reported) Documenting clinical information in the electronic or other health record Independently interpreting results (not separately reported) and communicating results to the patient/ family/caregiver Care coordination (not separately reported)  Note by: Gillis Santa, MD Date: 07/29/2022; Time: 3:24 PM

## 2022-07-29 NOTE — Patient Instructions (Addendum)
____________________________________________________________________________________________  General Risks and Possible Complications  Patient Responsibilities: It is important that you read this as it is part of your informed consent. It is our duty to inform you of the risks and possible complications associated with treatments offered to you. It is your responsibility as a patient to read this and to ask questions about anything that is not clear or that you believe was not covered in this document.  Patient's Rights: You have the right to refuse treatment. You also have the right to change your mind, even after initially having agreed to have the treatment done. However, under this last option, if you wait until the last second to change your mind, you may be charged for the materials used up to that point.  Introduction: Medicine is not an exact science. Everything in Medicine, including the lack of treatment(s), carries the potential for danger, harm, or loss (which is by definition: Risk). In Medicine, a complication is a secondary problem, condition, or disease that can aggravate an already existing one. All treatments carry the risk of possible complications. The fact that a side effects or complications occurs, does not imply that the treatment was conducted incorrectly. It must be clearly understood that these can happen even when everything is done following the highest safety standards.  No treatment: You can choose not to proceed with the proposed treatment alternative. The "PRO(s)" would include: avoiding the risk of complications associated with the therapy. The "CON(s)" would include: not getting any of the treatment benefits. These benefits fall under one of three categories: diagnostic; therapeutic; and/or palliative. Diagnostic benefits include: getting information which can ultimately lead to improvement of the disease or symptom(s). Therapeutic benefits are those associated with  the successful treatment of the disease. Finally, palliative benefits are those related to the decrease of the primary symptoms, without necessarily curing the condition (example: decreasing the pain from a flare-up of a chronic condition, such as incurable terminal cancer).  General Risks and Complications: These are associated to most interventional treatments. They can occur alone, or in combination. They fall under one of the following six (6) categories: no benefit or worsening of symptoms; bleeding; infection; nerve damage; allergic reactions; and/or death. No benefits or worsening of symptoms: In Medicine there are no guarantees, only probabilities. No healthcare provider can ever guarantee that a medical treatment will work, they can only state the probability that it may. Furthermore, there is always the possibility that the condition may worsen, either directly, or indirectly, as a consequence of the treatment. Bleeding: This is more common if the patient is taking a blood thinner, either prescription or over the counter (example: Goody Powders, Fish oil, Aspirin, Garlic, etc.), or if suffering a condition associated with impaired coagulation (example: Hemophilia, cirrhosis of the liver, low platelet counts, etc.). However, even if you do not have one on these, it can still happen. If you have any of these conditions, or take one of these drugs, make sure to notify your treating physician. Infection: This is more common in patients with a compromised immune system, either due to disease (example: diabetes, cancer, human immunodeficiency virus [HIV], etc.), or due to medications or treatments (example: therapies used to treat cancer and rheumatological diseases). However, even if you do not have one on these, it can still happen. If you have any of these conditions, or take one of these drugs, make sure to notify your treating physician. Nerve Damage: This is more common when the treatment is an    invasive one, but it can also happen with the use of medications, such as those used in the treatment of cancer. The damage can occur to small secondary nerves, or to large primary ones, such as those in the spinal cord and brain. This damage may be temporary or permanent and it may lead to impairments that can range from temporary numbness to permanent paralysis and/or brain death. Allergic Reactions: Any time a substance or material comes in contact with our body, there is the possibility of an allergic reaction. These can range from a mild skin rash (contact dermatitis) to a severe systemic reaction (anaphylactic reaction), which can result in death. Death: In general, any medical intervention can result in death, most of the time due to an unforeseen complication. ____________________________________________________________________________________________    ______________________________________________________________________  Preparing for your procedure  Appointments: If you think you may not be able to keep your appointment, call 24-48 hours in advance to cancel. We need time to make it available to others.  During your procedure appointment there will be: No Prescription Refills. No disability issues to discussed. No medication changes or discussions.  Instructions: Food intake: Avoid eating anything solid for at least 8 hours prior to your procedure. Clear liquid intake: You may take clear liquids such as water up to 2 hours prior to your procedure. (No carbonated drinks. No soda.) Transportation: Unless otherwise stated by your physician, bring a driver. Morning Medicines: Except for blood thinners, take all of your other morning medications with a sip of water. Make sure to take your heart and blood pressure medicines. If your blood pressure's lower number is above 100, the case will be rescheduled. Blood thinners: Make sure to stop your blood thinners as instructed.  If you take  a blood thinner, but were not instructed to stop it, call our office [phone] and ask to talk to a nurse. Not stopping a blood thinner prior to certain procedures could lead to serious complications. Diabetics on insulin: Notify the staff so that you can be scheduled 1st case in the morning. If your diabetes requires high dose insulin, take only  of your normal insulin dose the morning of the procedure and notify the staff that you have done so. Preventing infections: Shower with an antibacterial soap the morning of your procedure.  Build-up your immune system: Take 1000 mg of Vitamin C with every meal (3 times a day) the day prior to your procedure. Antibiotics: Inform the nursing staff if you are taking any antibiotics or if you have any conditions that may require antibiotics prior to procedures. (Example: recent joint implants)   Pregnancy: If you are pregnant make sure to notify the nursing staff. Not doing so may result in injury to the fetus, including death.  Sickness: If you have a cold, fever, or any active infections, call and cancel or reschedule your procedure. Receiving steroids while having an infection may result in complications. Arrival: You must be in the facility at least 30 minutes prior to your scheduled procedure. Tardiness: Your scheduled time is also the cutoff time. If you do not arrive at least 15 minutes prior to your procedure, you will be rescheduled.  Children: Do not bring any children with you. Make arrangements to keep them home. Dress appropriately: There is always a possibility that your clothing may get soiled. Avoid [employer] dresses. Valuables: Do not bring any jewelry or valuables.  Reasons to call and reschedule or cancel your procedure: (Following these recommendations will minimize the risk   of a serious complication.) Surgeries: Avoid having procedures within 2 weeks of any surgery. (Avoid for 2 weeks before or after any surgery). Flu Shots: Avoid having  procedures within 2 weeks of a flu shots or . (Avoid for 2 weeks before or after immunizations). Barium: Avoid having a procedure within 7-10 days after having had a radiological study involving the use of radiological contrast. (Myelograms, Barium swallow or enema study). Heart attacks: Avoid any elective procedures or surgeries for the initial 6 months after a "Myocardial Infarction" (Heart Attack). Blood thinners: It is imperative that you stop these medications before procedures. Let us know if you if you take any blood thinner.  Infection: Avoid procedures during or within two weeks of an infection (including chest colds or gastrointestinal problems). Symptoms associated with infections include: Localized redness, fever, chills, night sweats or profuse sweating, burning sensation when voiding, cough, congestion, stuffiness, runny nose, sore throat, diarrhea, nausea, vomiting, cold or Flu symptoms, recent or current infections. It is specially important if the infection is over the area that we intend to treat. Heart and lung problems: Symptoms that may suggest an active cardiopulmonary problem include: cough, chest pain, breathing difficulties or shortness of breath, dizziness, ankle swelling, uncontrolled high or unusually low blood pressure, and/or palpitations. If you are experiencing any of these symptoms, cancel your procedure and contact your primary care physician for an evaluation.  Remember:  Regular Business hours are:  Monday to Thursday 8:00 AM to 4:00 PM  Provider's Schedule: Francisco Naveira, MD:  Procedure days: Tuesday and Thursday 7:30 AM to 4:00 PM  Bilal Lateef, MD:  Procedure days: Monday and Wednesday 7:30 AM to 4:00 PM  ______________________________________________________________________   Facet Blocks Patient Information  Description: The facets are joints in the spine between the vertebrae.  Like any joints in the body, facets can become irritated and painful.   Arthritis can also effect the facets.  By injecting steroids and local anesthetic in and around these joints, we can temporarily block the nerve supply to them.  Steroids act directly on irritated nerves and tissues to reduce selling and inflammation which often leads to decreased pain.  Facet blocks may be done anywhere along the spine from the neck to the low back depending upon the location of your pain.   After numbing the skin with local anesthetic (like Novocaine), a small needle is passed onto the facet joints under x-ray guidance.  You may experience a sensation of pressure while this is being done.  The entire block usually lasts about 15-25 minutes.   Conditions which may be treated by facet blocks:  Low back/buttock pain Neck/shoulder pain Certain types of headaches  Preparation for the injection:  Do not eat any solid food or dairy products within 8 hours of your appointment. You may drink clear liquid up to 3 hours before appointment.  Clear liquids include water, black coffee, juice or soda.  No milk or cream please. You may take your regular medication, including pain medications, with a sip of water before your appointment.  Diabetics should hold regular insulin (if taken separately) and take 1/2 normal NPH dose the morning of the procedure.  Carry some sugar containing items with you to your appointment. A driver must accompany you and be prepared to drive you home after your procedure. Bring all your current medications with you. An IV may be inserted and sedation may be given at the discretion of the physician. A blood pressure cuff, EKG and other monitors will   often be applied during the procedure.  Some patients may need to have extra oxygen administered for a short period. You will be asked to provide medical information, including your allergies and medications, prior to the procedure.  We must know immediately if you are taking blood thinners (like Coumadin/Warfarin) or if  you are allergic to IV iodine contrast (dye).  We must know if you could possible be pregnant.  Possible side-effects:  Bleeding from needle site Infection (rare, may require surgery) Nerve injury (rare) Numbness & tingling (temporary) Difficulty urinating (rare, temporary) Spinal headache (a headache worse with upright posture) Light-headedness (temporary) Pain at injection site (serveral days) Decreased blood pressure (rare, temporary) Weakness in arm/leg (temporary) Pressure sensation in back/neck (temporary)   Call if you experience:  Fever/chills associated with headache or increased back/neck pain Headache worsened by an upright position New onset, weakness or numbness of an extremity below the injection site Hives or difficulty breathing (go to the emergency room) Inflammation or drainage at the injection site(s) Severe back/neck pain greater than usual New symptoms which are concerning to you  Please note:  Although the local anesthetic injected can often make your back or neck feel good for several hours after the injection, the pain will likely return. It takes 3-7 days for steroids to work.  You may not notice any pain relief for at least one week.  If effective, we will often do a series of 2-3 injections spaced 3-6 weeks apart to maximally decrease your pain.  After the initial series, you may be a candidate for a more permanent nerve block of the facets.  If you have any questions, please call [phone] [facility] 

## 2022-08-21 ENCOUNTER — Ambulatory Visit
Admission: RE | Admit: 2022-08-21 | Discharge: 2022-08-21 | Disposition: A | Discharge status: Home / Self Care | Payer: Medicare Other | Source: Ambulatory Visit | Attending: Internal Medicine | Admitting: Internal Medicine

## 2022-08-21 ENCOUNTER — Other Ambulatory Visit: Payer: Self-pay | Admitting: Internal Medicine

## 2022-08-21 DIAGNOSIS — M7989 Other specified soft tissue disorders: Secondary | ICD-10-CM

## 2022-09-10 ENCOUNTER — Ambulatory Visit
Admission: RE | Admit: 2022-09-10 | Discharge: 2022-09-10 | Disposition: A | Discharge status: Home / Self Care | Payer: Medicare Other | Source: Ambulatory Visit | Attending: Student in an Organized Health Care Education/Training Program | Admitting: Student in an Organized Health Care Education/Training Program

## 2022-09-10 ENCOUNTER — Ambulatory Visit
Payer: Medicare Other | Attending: Student in an Organized Health Care Education/Training Program | Admitting: Student in an Organized Health Care Education/Training Program

## 2022-09-10 ENCOUNTER — Encounter: Payer: Self-pay | Admitting: Student in an Organized Health Care Education/Training Program

## 2022-09-10 DIAGNOSIS — G894 Chronic pain syndrome: Secondary | ICD-10-CM | POA: Diagnosis present

## 2022-09-10 DIAGNOSIS — M47816 Spondylosis without myelopathy or radiculopathy, lumbar region: Secondary | ICD-10-CM | POA: Diagnosis present

## 2022-09-10 MED ORDER — LIDOCAINE HCL 2 % IJ SOLN
20.0000 mL | Freq: Once | INTRAMUSCULAR | Status: DC
  Filled 2022-09-10: qty 20

## 2022-09-10 MED ORDER — DEXAMETHASONE SODIUM PHOSPHATE 10 MG/ML IJ SOLN
10.0000 mg | Freq: Once | INTRAMUSCULAR | Status: DC
  Filled 2022-09-10: qty 1

## 2022-09-10 MED ORDER — ROPIVACAINE HCL 2 MG/ML IJ SOLN
9.0000 mL | Freq: Once | INTRAMUSCULAR | Status: DC
  Filled 2022-09-10: qty 20

## 2022-09-10 NOTE — Progress Notes (Signed)
PROVIDER NOTE: Interpretation of information contained herein should be left to medically-trained personnel. Specific patient instructions are provided elsewhere under "Patient Instructions" section of medical record. This document was created in part using STT-dictation technology, any transcriptional errors that may result from this process are unintentional.  Patient: Heather Hartman Type: Established DOB: 01/23/1951 MRN: [id number] PCP: Enid Baas, MD  Service: Procedure DOS: 09/10/2022 Setting: Ambulatory Location: Ambulatory outpatient facility Delivery: Face-to-face Provider: Edward Jolly, MD Specialty: Interventional Pain Management Specialty designation: 09 Location: Outpatient facility Ref. Prov.: Edward Jolly, MD       Interventional Therapy   Procedure: Lumbar Facet, Medial Branch Block(s) #1  Laterality: Bilateral  Level: L3, L4, and L5 Medial Branch Level(s). Injecting these levels blocks the L3-4 and L4-5 lumbar facet joints. Imaging: Fluoroscopic guidance         Anesthesia: Local anesthesia (1-2% Lidocaine) DOS: 09/10/2022 Performed by: Edward Jolly, MD  Primary Purpose: Diagnostic/Therapeutic Indications: Low back pain severe enough to impact quality of life or function. 1. Lumbar facet arthropathy   2. Chronic pain syndrome    NAS-11 Pain score:   Pre-procedure: 7 /10   Post-procedure: 0-No pain/10     Position / Prep / Materials:  Position: Prone  Prep solution: DuraPrep (Iodine Povacrylex [0.7% available iodine] and Isopropyl Alcohol, 74% w/w) Area Prepped: Posterolateral Lumbosacral Spine (Wide prep: From the lower border of the scapula down to the end of the tailbone and from flank to flank.)  Materials:  Tray: Block Needle(s):  Type: Spinal  Gauge (G): 22  Length: 5-in Qty: 2      Pre-op H&P Assessment:  Heather Hartman is a 72 y.o. (year old), female patient, seen today for interventional treatment. She  has no past surgical history on file.  Heather Hartman has a current medication list which includes the following prescription(s): acetaminophen, clotrimazole-betamethasone, duloxetine, furosemide, lisinopril-hydrochlorothiazide, and tramadol, and the following Facility-Administered Medications: dexamethasone, dexamethasone, lidocaine, ropivacaine (pf) 2 mg/ml (0.2%), and ropivacaine (pf) 2 mg/ml (0.2%). Her primarily concern today is the Back Pain (Lumbar bilateral )  Initial Vital Signs:  Pulse/HCG Rate: 88  Temp: (!) 96.6 F (35.9 C) Resp: 16 BP: 130/76 SpO2: 100 %  BMI: Estimated body mass index is 38.95 kg/m as calculated from the following:   Height as of this encounter: 5' 6.5" (1.689 m).   Weight as of this encounter: 245 lb (111.1 kg).  Risk Assessment: Allergies: Reviewed. She is allergic to vioxx [rofecoxib] and atorvastatin.  Allergy Precautions: None required Coagulopathies: Reviewed. None identified.  Blood-thinner therapy: None at this time Active Infection(s): Reviewed. None identified. Heather Hartman is afebrile  Site Confirmation: Heather Hartman was asked to confirm the procedure and laterality before marking the site Procedure checklist: Completed Consent: Before the procedure and under the influence of no sedative(s), amnesic(s), or anxiolytics, the patient was informed of the treatment options, risks and possible complications. To fulfill our ethical and legal obligations, as recommended by the American Medical Association's Code of Ethics, I have informed the patient of my clinical impression; the nature and purpose of the treatment or procedure; the risks, benefits, and possible complications of the intervention; the alternatives, including doing nothing; the risk(s) and benefit(s) of the alternative treatment(s) or procedure(s); and the risk(s) and benefit(s) of doing nothing. The patient was provided information about the general risks and possible complications associated with the procedure. These may include, but are not  limited to: failure to achieve desired goals, infection, bleeding, organ or nerve damage, allergic reactions, paralysis,  and death. In addition, the patient was informed of those risks and complications associated to Spine-related procedures, such as failure to decrease pain; infection (i.e.: Meningitis, epidural or intraspinal abscess); bleeding (i.e.: epidural hematoma, subarachnoid hemorrhage, or any other type of intraspinal or peri-dural bleeding); organ or nerve damage (i.e.: Any type of peripheral nerve, nerve root, or spinal cord injury) with subsequent damage to sensory, motor, and/or autonomic systems, resulting in permanent pain, numbness, and/or weakness of one or several areas of the body; allergic reactions; (i.e.: anaphylactic reaction); and/or death. Furthermore, the patient was informed of those risks and complications associated with the medications. These include, but are not limited to: allergic reactions (i.e.: anaphylactic or anaphylactoid reaction(s)); adrenal axis suppression; blood sugar elevation that in diabetics may result in ketoacidosis or comma; water retention that in patients with history of congestive heart failure may result in shortness of breath, pulmonary edema, and decompensation with resultant heart failure; weight gain; swelling or edema; medication-induced neural toxicity; particulate matter embolism and blood vessel occlusion with resultant organ, and/or nervous system infarction; and/or aseptic necrosis of one or more joints. Finally, the patient was informed that Medicine is not an exact science; therefore, there is also the possibility of unforeseen or unpredictable risks and/or possible complications that may result in a catastrophic outcome. The patient indicated having understood very clearly. We have given the patient no guarantees and we have made no promises. Enough time was given to the patient to ask questions, all of which were answered to the patient's  satisfaction. Heather Hartman has indicated that she wanted to continue with the procedure. Attestation: I, the ordering provider, attest that I have discussed with the patient the benefits, risks, side-effects, alternatives, likelihood of achieving goals, and potential problems during recovery for the procedure that I have provided informed consent. Date  Time: 09/10/2022  9:43 AM   Pre-Procedure Preparation:  Monitoring: As per clinic protocol. Respiration, ETCO2, SpO2, BP, heart rate and rhythm monitor placed and checked for adequate function Safety Precautions: Patient was assessed for positional comfort and pressure points before starting the procedure. Time-out: I initiated and conducted the "Time-out" before starting the procedure, as per protocol. The patient was asked to participate by confirming the accuracy of the "Time Out" information. Verification of the correct person, site, and procedure were performed and confirmed by me, the nursing staff, and the patient. "Time-out" conducted as per Joint Commission's Universal Protocol (UP.01.01.01). Time: 1103 Start Time: 1103 hrs.  Description of Procedure:          Laterality: (see above) Targeted Levels: (see above)  Safety Precautions: Aspiration looking for blood return was conducted prior to all injections. At no point did we inject any substances, as a needle was being advanced. Before injecting, the patient was told to immediately notify me if she was experiencing any new onset of "ringing in the ears, or metallic taste in the mouth". No attempts were made at seeking any paresthesias. Safe injection practices and needle disposal techniques used. Medications properly checked for expiration dates. SDV (single dose vial) medications used. After the completion of the procedure, all disposable equipment used was discarded in the proper designated medical waste containers. Local Anesthesia: Protocol guidelines were followed. The patient was  positioned over the fluoroscopy table. The area was prepped in the usual manner. The time-out was completed. The target area was identified using fluoroscopy. A 12-in [place], straight, sterile hemostat was used with fluoroscopic guidance to locate the targets for each level blocked. Once located,  the skin was marked with an approved surgical skin marker. Once all sites were marked, the skin (epidermis, dermis, and hypodermis), as well as deeper tissues (fat, connective tissue and muscle) were infiltrated with a small amount of a short-acting local anesthetic, loaded on a 10cc syringe with a 25G, 1.5-in  Needle. An appropriate amount of time was allowed for local anesthetics to take effect before proceeding to the next step. Local Anesthetic: Lidocaine 2.0% The unused portion of the local anesthetic was discarded in the proper designated containers. Technical description of process:  L3 Medial Branch Nerve Block (MBB): The target area for the L3 medial branch is at the junction of the postero-lateral aspect of the superior articular process and the superior, posterior, and medial edge of the transverse process of L4. Under fluoroscopic guidance, a Quincke needle was inserted until contact was made with os over the superior postero-lateral aspect of the pedicular shadow (target area). After negative aspiration for blood,7mL of the nerve block solution was injected without difficulty or complication. The needle was removed intact. L4 Medial Branch Nerve Block (MBB): The target area for the L4 medial branch is at the junction of the postero-lateral aspect of the superior articular process and the superior, posterior, and medial edge of the transverse process of L5. Under fluoroscopic guidance, a Quincke needle was inserted until contact was made with os over the superior postero-lateral aspect of the pedicular shadow (target area). After negative aspiration for blood, 2mL of the nerve block solution was injected  without difficulty or complication. The needle was removed intact. L5 Medial Branch Nerve Block (MBB): The target area for the L5 medial branch is at the junction of the postero-lateral aspect of the superior articular process and the superior, posterior, and medial edge of the sacral ala. Under fluoroscopic guidance, a Quincke needle was inserted until contact was made with os over the superior postero-lateral aspect of the pedicular shadow (target area). After negative aspiration for blood,61mL of the nerve block solution was injected without difficulty or complication. The needle was removed intact.  Once the entire procedure was completed, the treated area was cleaned, making sure to leave some of the prepping solution back to take advantage of its Marken term bactericidal properties.         Illustration of the posterior view of the lumbar spine and the posterior neural structures. Laminae of L2 through S1 are labeled. DPRL5, dorsal primary ramus of L5; DPRS1, dorsal primary ramus of S1; DPR3, dorsal primary ramus of L3; FJ, facet (zygapophyseal) joint L3-L4; I, inferior articular process of L4; LB1, lateral branch of dorsal primary ramus of L1; IAB, inferior articular branches from L3 medial branch (supplies L4-L5 facet joint); IBP, intermediate branch plexus; MB3, medial branch of dorsal primary ramus of L3; NR3, third lumbar nerve root; S, superior articular process of L5; SAB, superior articular branches from L4 (supplies L4-5 facet joint also); TP3, transverse process of L3.   Facet Joint Innervation (* possible contribution)  L1-2 T12, L1 (L2*)  Medial Branch  L2-3 L1, L2 (L3*)         "          "  L3-4 L2, L3 (L4*)         "          "  L4-5 L3, L4 (L5*)         "          "  L5-S1 L4, L5, S1          "          "  Vitals:   09/10/22 1055 09/10/22 1103 09/10/22 1108 09/10/22 1114  BP: (!) 127/98 (!) 140/102 (!) 141/87 (!) 143/99  Pulse: 86 87 75 75  Resp: 16 15 16 16   Temp:       TempSrc:      SpO2: 98% 97% 98% 98%  Weight:      Height:         End Time: 1111 hrs.  Imaging Guidance (Spinal):          Type of Imaging Technique: Fluoroscopy Guidance (Spinal) Indication(s): Assistance in needle guidance and placement for procedures requiring needle placement in or near specific anatomical locations not easily accessible without such assistance. Exposure Time: Please see nurses notes. Contrast: None used. Fluoroscopic Guidance: I was personally present during the use of fluoroscopy. "Tunnel Vision Technique" used to obtain the best possible view of the target area. Parallax error corrected before commencing the procedure. "Direction-depth-direction" technique used to introduce the needle under continuous pulsed fluoroscopy. Once target was reached, antero-posterior, oblique, and lateral fluoroscopic projection used confirm needle placement in all planes. Images permanently stored in EMR. Interpretation: No contrast injected. I personally interpreted the imaging intraoperatively. Adequate needle placement confirmed in multiple planes. Permanent images saved into the patient's record.  Post-operative Assessment:  Post-procedure Vital Signs:  Pulse/HCG Rate: 75  Temp: (!) 96.6 F (35.9 C) Resp: 16 BP: (!) 143/99 SpO2: 98 %  EBL: None  Complications: No immediate post-treatment complications observed by team, or reported by patient.  Note: The patient tolerated the entire procedure well. A repeat set of vitals were taken after the procedure and the patient was kept under observation following institutional policy, for this type of procedure. Post-procedural neurological assessment was performed, showing return to baseline, prior to discharge. The patient was provided with post-procedure discharge instructions, including a section on how to identify potential problems. Should any problems arise concerning this procedure, the patient was given instructions to immediately  contact us, at any time, without hesitation. In any case, we plan to contact the patient by telephone for a follow-up status report regarding this interventional procedure.  Comments:  No additional relevant information.  Plan of Care (POC)  Orders:  Orders Placed This Encounter  Procedures   DG PAIN CLINIC C-ARM 1-60 MIN NO REPORT    Intraoperative interpretation by procedural physician at [facility].    Standing Status:   Standing    Number of Occurrences:   1    Order Specific Question:   Reason for exam:    Answer:   Assistance in needle guidance and placement for procedures requiring needle placement in or near specific anatomical locations not easily accessible without such assistance.     Medications ordered for procedure: Meds ordered this encounter  Medications   lidocaine (XYLOCAINE) 2 % (with pres) injection 400 mg   dexamethasone (DECADRON) injection 10 mg   dexamethasone (DECADRON) injection 10 mg   ropivacaine (PF) 2 mg/mL (0.2%) (NAROPIN) injection 9 mL   ropivacaine (PF) 2 mg/mL (0.2%) (NAROPIN) injection 9 mL   Medications administered: Elease Hashimoto A. Pantaleo had no medications administered during this visit.  See the medical record for exact dosing, route, and time of administration.  Follow-up plan:   Return in about 4 weeks (around 10/08/2022) for Post Procedure Evaluation, in person.       L4-5 ESI 01/29/2022, B/L L3,4,5 MBNB #1 09/10/22     Recent Visits Date Type Provider Dept  07/29/22 Office Visit Edward Jolly, MD [facility]  07/10/22 Office Visit Edward Jolly, MD [facility]  Showing recent visits within past 90 days and meeting all other requirements Today's Visits Date Type Provider Dept  09/10/22 Procedure visit Edward Jolly, MD [facility]  Showing today's visits and meeting all other requirements Future Appointments Date Type Provider Dept  10/02/22 Appointment Edward Jolly, MD [facility]   10/13/22 Appointment Edward Jolly, MD [facility]  Showing future appointments within next 90 days and meeting all other requirements  Disposition: Discharge home  Discharge (Date  Time): 09/10/2022; 1114 hrs.   Primary Care Physician: Enid Baas, MD Location: [facility] Note by: Edward Jolly, MD (TTS technology used. I apologize for any typographical errors that were not detected and corrected.) Date: 09/10/2022; Time: 2:28 PM  Disclaimer:  Medicine is not an Visual merchandiser. The only guarantee in medicine is that nothing is guaranteed. It is important to note that the decision to proceed with this intervention was based on the information collected from the patient. The Data and conclusions were drawn from the patient's questionnaire, the interview, and the physical examination. Because the information was provided in large part by the patient, it cannot be guaranteed that it has not been purposely or unconsciously manipulated. Every effort has been made to obtain as much relevant data as possible for this evaluation. It is important to note that the conclusions that lead to this procedure are derived in large part from the available data. Always take into account that the treatment will also be dependent on availability of resources and existing treatment guidelines, considered by other Pain Management Practitioners as being common knowledge and practice, at the time of the intervention. For Medico-Legal purposes, it is also important to point out that variation in procedural techniques and pharmacological choices are the acceptable norm. The indications, contraindications, technique, and results of the above procedure should only be interpreted and judged by a Board-Certified Interventional Pain Specialist with extensive familiarity and expertise in the same exact procedure and technique.

## 2022-09-10 NOTE — Patient Instructions (Signed)
____________________________________________________________________________________________  Post-Procedure Discharge Instructions  Instructions:  Apply ice:   Purpose: This will minimize any swelling and discomfort after procedure.   When: Day of procedure, as soon as you get home.  How: Fill a plastic sandwich bag with crushed ice. Cover it with a small towel and apply to injection site.  How [place]: (15 min on, 15 min off) Apply for 15 minutes then remove x 15 minutes.  Repeat sequence on day of procedure, until you go to bed.  Apply heat:   Purpose: To treat any soreness and discomfort from the procedure.  When: Starting the next day after the procedure.  How: Apply heat to procedure site starting the day following the procedure.  How [place]: May continue to repeat daily, until discomfort goes away.  Food intake: Start with clear liquids (like water) and advance to regular food, as tolerated.   Physical activities: Keep activities to a minimum for the first 8 hours after the procedure. After that, then as tolerated.  Driving: If you have received any sedation, be responsible and do not drive. You are not allowed to drive for 24 hours after having sedation.  Blood thinner: (Applies only to those taking blood thinners) You may restart your blood thinner 6 hours after your procedure.  Insulin: (Applies only to Diabetic patients taking insulin) As soon as you can eat, you may resume your normal dosing schedule.  Infection prevention: Keep procedure site clean and dry. Shower daily and clean area with soap and water.  Post-procedure Pain Diary: Extremely important that this be done correctly and accurately. Recorded information will be used to determine the next step in treatment. For the purpose of accuracy, follow these rules:  Evaluate only the area treated. Do not report or include pain from an untreated area. For the purpose of this evaluation, ignore all other areas of pain,  except for the treated area.  After your procedure, avoid taking a Fristoe nap and attempting to complete the pain diary after you wake up. Instead, set your alarm clock to go off every hour, on the hour, for the initial 8 hours after the procedure. Document the duration of the numbing medicine, and the relief you are getting from it.  Do not go to sleep and attempt to complete it later. It will not be accurate. If you received sedation, it is likely that you were given a medication that may cause amnesia. Because of this, completing the diary at a later time may cause the information to be inaccurate. This information is needed to plan your care.  Follow-up appointment: Keep your post-procedure follow-up evaluation appointment after the procedure (usually 2 weeks for most procedures, 6 weeks for radiofrequencies). DO NOT FORGET to bring you pain diary with you.   Expect: (What should I expect to see with my procedure?)  From numbing medicine (AKA: Local Anesthetics): Numbness or decrease in pain. You may also experience some weakness, which if present, could last for the duration of the local anesthetic.  Onset: Full effect within 15 minutes of injected.  Duration: It will depend on the type of local anesthetic used. On the average, 1 to 8 hours.   From steroids (Applies only if steroids were used): Decrease in swelling or inflammation. Once inflammation is improved, relief of the pain will follow.  Onset of benefits: Depends on the amount of swelling present. The more swelling, the longer it will take for the benefits to be seen. In some cases, up to 10 days.    Duration: Steroids will stay in the system x 2 weeks. Duration of benefits will depend on multiple posibilities including persistent irritating factors.  Side-effects: If present, they may typically last 2 weeks (the duration of the steroids).  Frequent: Cramps (if they occur, drink Gatorade and take over-the-counter Magnesium 450-500 mg  once to twice a day); water retention with temporary weight gain; increases in blood sugar; decreased immune system response; increased appetite.  Occasional: Facial flushing (red, warm cheeks); mood swings; menstrual changes.  Uncommon: Gunning-term decrease or suppression of natural hormones; bone thinning. (These are more common with higher doses or more frequent use. This is why we prefer that our patients avoid having any injection therapies in other practices.)   Very Rare: Severe mood changes; psychosis; aseptic necrosis.  From procedure: Some discomfort is to be expected once the numbing medicine wears off. This should be minimal if ice and heat are applied as instructed.  Call if: (When should I call?)  You experience numbness and weakness that gets worse with time, as opposed to wearing off.  New onset bowel or bladder incontinence. (Applies only to procedures done in the spine)  Emergency Numbers:  Durning business hours (Monday - Thursday, 8:00 AM - 4:00 PM) (Friday, 9:00 AM - 12:00 Noon): [phone]  After hours: [phone]  NOTE: If you are having a problem and are unable connect with, or to talk to a provider, then go to your nearest urgent care or emergency department. If the problem is serious and urgent, please call 911. ____________________________________________________________________________________________    

## 2022-09-10 NOTE — Progress Notes (Signed)
Safety precautions to be maintained throughout the outpatient stay will include: orient to surroundings, keep bed in low position, maintain call bell within reach at all times, provide assistance with transfer out of bed and ambulation.  

## 2022-09-11 ENCOUNTER — Telehealth: Payer: Self-pay | Admitting: *Deleted

## 2022-09-11 NOTE — Telephone Encounter (Signed)
No problems post procedure. 

## 2022-10-02 ENCOUNTER — Ambulatory Visit
Payer: Medicare Other | Attending: Student in an Organized Health Care Education/Training Program | Admitting: Student in an Organized Health Care Education/Training Program

## 2022-10-02 ENCOUNTER — Encounter: Payer: Self-pay | Admitting: Student in an Organized Health Care Education/Training Program

## 2022-10-02 VITALS — BP 137/77 | HR 97 | Temp 96.8°F | Resp 17 | Ht 66.0 in | Wt 240.0 lb

## 2022-10-02 DIAGNOSIS — M533 Sacrococcygeal disorders, not elsewhere classified: Secondary | ICD-10-CM | POA: Diagnosis not present

## 2022-10-02 DIAGNOSIS — M48062 Spinal stenosis, lumbar region with neurogenic claudication: Secondary | ICD-10-CM | POA: Diagnosis not present

## 2022-10-02 DIAGNOSIS — M5136 Other intervertebral disc degeneration, lumbar region: Secondary | ICD-10-CM | POA: Insufficient documentation

## 2022-10-02 DIAGNOSIS — M47816 Spondylosis without myelopathy or radiculopathy, lumbar region: Secondary | ICD-10-CM | POA: Diagnosis not present

## 2022-10-02 MED ORDER — TRAMADOL HCL 50 MG PO TABS: 50.0000 mg | ORAL_TABLET | Freq: Three times a day (TID) | ORAL | 2 refills | Status: DC | PRN

## 2022-10-02 NOTE — Progress Notes (Signed)
Nursing Pain Medication Assessment:  Safety precautions to be maintained throughout the outpatient stay will include: orient to surroundings, keep bed in low position, maintain call bell within reach at all times, provide assistance with transfer out of bed and ambulation.  Medication Inspection Compliance: Pill count conducted under aseptic conditions, in front of the patient. Neither the pills nor the bottle was removed from the patient's sight at any time. Once count was completed pills were immediately returned to the patient in their original bottle.  Medication: Tramadol (Ultram) Pill/Patch Count:  15 of 30 pills remain Pill/Patch Appearance: Markings consistent with prescribed medication Bottle Appearance: Standard pharmacy container. Clearly labeled. Filled Date: 5 / 21 / 2024 Last Medication intake:  Today

## 2022-10-02 NOTE — Progress Notes (Signed)
PROVIDER NOTE: Information contained herein reflects review and annotations entered in association with encounter. Interpretation of such information and data should be left to medically-trained personnel. Information provided to patient can be located elsewhere in the medical record under "Patient Instructions". Document created using STT-dictation technology, any transcriptional errors that may result from process are unintentional.    Patient: Heather Hartman  Service Category: E/M  Provider: Edward Jolly, MD  DOB: 1950-07-18  DOS: 10/02/2022  Referring Provider: Enid Baas, MD  MRN: [id number]  Specialty: Interventional Pain Management  PCP: Enid Baas, MD  Type: Established Patient  Setting: Ambulatory outpatient    Location: Office  Delivery: Face-to-face     HPI  [name], a 72 y.o. year old female, is here today because of her Lumbar facet arthropathy [M47.816]. Heather Hartman primary complain today is Hip Pain (bilat)  Pertinent problems: Heather Hartman has Chronic radicular lumbar pain; Spinal stenosis, lumbar region, with neurogenic claudication; Lumbar spondylosis; Lumbar degenerative disc disease; Sacroiliac joint pain; Chronic pain syndrome; and Pain management contract signed on their pertinent problem list. Pain Assessment: Severity of Chronic pain is reported as a 7 /10. Location: Hip Right, Left/down both legs to bottom of feet, bilat. Onset: More than a month ago. Quality: Constant. Timing: Constant. Modifying factor(s): meds. Vitals:  height is 5\' 6"  (1.676 m) and weight is 240 lb (108.9 kg). Her temporal temperature is 96.8 F (36 C) (abnormal). Her blood pressure is 137/77 and her pulse is 97. Her respiration is 17 and oxygen saturation is 99%.  BMI: Estimated body mass index is 38.74 kg/m as calculated from the following:   Height as of this encounter: 5\' 6"  (1.676 m).   Weight as of this encounter: 240 lb (108.9 kg). Last encounter: 07/29/2022. Last procedure:  09/10/2022.  Reason for encounter: both, medication management and post-procedure evaluation and assessment.   Unfortunately, diagnostic lumbar facet medial branch nerve blocks.  Do not recommend second injection or RFA Patient does have lumbar spinal stenosis with neurogenic claudications.  We have discussed mild in the past, patient wants to hold off on this at this time. She is not getting benefit with tramadol extended release, she would like to return back to tramadol 50 mg immediate release however we can increase that to every 8 hours as needed for 12 hours as needed.  Pharmacotherapy Assessment  Analgesic: Tramadol 50 mg every 8 hours as needed.  Monitoring: [place] PMP: PDMP reviewed during this encounter.       Pharmacotherapy: No side-effects or adverse reactions reported. Compliance: No problems identified. Effectiveness: Clinically acceptable.  Heather Mattes, RN  10/02/2022  2:28 PM  Sign when Signing Visit Nursing Pain Medication Assessment:  Safety precautions to be maintained throughout the outpatient stay will include: orient to surroundings, keep bed in low position, maintain call bell within reach at all times, provide assistance with transfer out of bed and ambulation.  Medication Inspection Compliance: Pill count conducted under aseptic conditions, in front of the patient. Neither the pills nor the bottle was removed from the patient's sight at any time. Once count was completed pills were immediately returned to the patient in their original bottle.  Medication: Tramadol (Ultram) Pill/Patch Count:  15 of 30 pills remain Pill/Patch Appearance: Markings consistent with prescribed medication Bottle Appearance: Standard pharmacy container. Clearly labeled. Filled Date: 5 / 21 / 2024 Last Medication intake:  Today    No results found for: "CBDTHCR" No results found for: "D8THCCBX" No results found for: "  D9THCCBX"  UDS:  Summary  Date Value Ref Range Status  01/14/2022  Note  Final    Comment:    ==================================================================== Compliance Drug Analysis, Ur ==================================================================== Test                             Result       Flag       Units  Drug Present and Declared for Prescription Verification   Tramadol                       >1976        EXPECTED   ng/mg creat   O-Desmethyltramadol            >1976        EXPECTED   ng/mg creat   N-Desmethyltramadol            769          EXPECTED   ng/mg creat    Source of tramadol is a prescription medication. O-desmethyltramadol    and N-desmethyltramadol are expected metabolites of tramadol.    Acetaminophen                  PRESENT      EXPECTED  Drug Present not Declared for Prescription Verification   Naproxen                       PRESENT      UNEXPECTED   Diphenhydramine                PRESENT      UNEXPECTED  Drug Absent but Declared for Prescription Verification   Pregabalin                     Not Detected UNEXPECTED ==================================================================== Test                      Result    Flag   Units      Ref Range   Creatinine              253              mg/dL      >=95 ==================================================================== Declared Medications:  The flagging and interpretation on this report are based on the  following declared medications.  Unexpected results may arise from  inaccuracies in the declared medications.   **Note: The testing scope of this panel includes these medications:   Pregabalin (Lyrica)  Tramadol (Ultram)   **Note: The testing scope of this panel does not include small to  moderate amounts of these reported medications:   Acetaminophen (Tylenol)   **Note: The testing scope of this panel does not include the  following reported medications:   Furosemide (Lasix)  Hydrochlorothiazide (Zestoretic)  Lisinopril (Zestoretic)  Mouthwash   Potassium Chloride ==================================================================== For clinical consultation, please call [phone]. ====================================================================       ROS  Constitutional: Denies any fever or chills Gastrointestinal: No reported hemesis, hematochezia, vomiting, or acute GI distress Musculoskeletal:  Low back pain Neurological: No reported episodes of acute onset apraxia, aphasia, dysarthria, agnosia, amnesia, paralysis, loss of coordination, or loss of consciousness  Medication Review  DULoxetine, acetaminophen, clotrimazole-betamethasone, furosemide, lisinopril-hydrochlorothiazide, and traMADol  History Review  Allergy: Ms. Wareing is allergic to vioxx [rofecoxib] and atorvastatin. Drug: Heather Hartman  has no history on file for  drug use. Alcohol:  has no history on file for alcohol use. Tobacco:  reports that she has been smoking cigarettes. She has a 30.00 pack-year smoking history. She has never used smokeless tobacco. Social: Heather Hartman  reports that she has been smoking cigarettes. She has a 30.00 pack-year smoking history. She has never used smokeless tobacco. Medical:  has a past medical history of Anxiety, Depression, GERD (gastroesophageal reflux disease), Hyperlipidemia, and Hypertension. Surgical: Heather Hartman  has no past surgical history on file. Family: family history is not on file.  Laboratory Chemistry Profile   Renal No results found for: "BUN", "CREATININE", "LABCREA", "BCR", "GFR", "GFRAA", "GFRNONAA", "LABVMA", "EPIRU", "EPINEPH24HUR", "NOREPRU", "NOREPI24HUR", "DOPARU", "DOPAM24HRUR"  Hepatic No results found for: "AST", "ALT", "ALBUMIN", "ALKPHOS", "HCVAB", "AMYLASE", "LIPASE", "AMMONIA"  Electrolytes No results found for: "NA", "K", "CL", "CALCIUM", "MG", "PHOS"  Bone No results found for: "VD25OH", "VD125OH2TOT", "WU9811BJ4", "NW2956OZ3", "25OHVITD1", "25OHVITD2", "25OHVITD3", "TESTOFREE", "TESTOSTERONE"   Inflammation (CRP: Acute Phase) (ESR: Chronic Phase) No results found for: "CRP", "ESRSEDRATE", "LATICACIDVEN"       Note: Above Lab results reviewed.  Recent Imaging Review  DG PAIN CLINIC C-ARM 1-60 MIN NO REPORT Fluoro was used, but no Radiologist interpretation will be provided.  Please refer to "NOTES" tab for provider progress note. Note: Reviewed        Physical Exam  General appearance: Well nourished, well developed, and well hydrated. In no apparent acute distress Mental status: Alert, oriented x 3 (person, place, & time)       Respiratory: No evidence of acute respiratory distress Eyes: PERLA Vitals: BP 137/77   Pulse 97   Temp (!) 96.8 F (36 C) (Temporal)   Resp 17   Ht 5\' 6"  (1.676 m)   Wt 240 lb (108.9 kg)   SpO2 99%   BMI 38.74 kg/m  BMI: Estimated body mass index is 38.74 kg/m as calculated from the following:   Height as of this encounter: 5\' 6"  (1.676 m).   Weight as of this encounter: 240 lb (108.9 kg). Ideal: Ideal body weight: 59.3 kg (130 lb 11.7 oz) Adjusted ideal body weight: 79.1 kg (174 lb 7 oz)  Low back pain, worse with lumbar extension  Assessment   Diagnosis Status  1. Lumbar facet arthropathy   2. Spinal stenosis, lumbar region, with neurogenic claudication   3. Lumbar degenerative disc disease   4. Sacroiliac joint pain    Persistent Persistent Persistent   Plan of Care    Heather Hartman has a current medication list which includes the following [place]-term medication(s): duloxetine, furosemide, and lisinopril-hydrochlorothiazide.  Pharmacotherapy (Medications Ordered): Meds ordered this encounter  Medications   traMADol (ULTRAM) 50 MG tablet    Sig: Take 1 tablet (50 mg total) by mouth every 8 (eight) hours as needed for severe pain.    Dispense:  90 tablet    Refill:  2    Fill one day early if pharmacy is closed on scheduled refill date. Do not fill until:  To last until:    Follow-up plan:   Return in about 3  months (around 01/02/2023) for Medication Management, in person.      L4-5 ESI 01/29/2022, B/L L3,4,5 MBNB #1 09/10/22      Recent Visits Date Type Provider Dept  09/10/22 Procedure visit Edward Jolly, MD [facility]  07/29/22 Office Visit Edward Jolly, MD [facility]  07/10/22 Office Visit Edward Jolly, MD [facility]  Showing recent visits within past 90 days  and meeting all other requirements Today's Visits Date Type Provider Dept  10/02/22 Office Visit Edward Jolly, MD [facility]  Showing today's visits and meeting all other requirements Future Appointments Date Type Provider Dept  10/13/22 Appointment Edward Jolly, MD [facility]  12/25/22 Appointment Edward Jolly, MD [facility]  Showing future appointments within next 90 days and meeting all other requirements  I discussed the assessment and treatment plan with the patient. The patient was provided an opportunity to ask questions and all were answered. The patient agreed with the plan and demonstrated an understanding of the instructions.  Patient advised to call back or seek an in-person evaluation if the symptoms or condition worsens.  Duration of encounter: 30 minutes.  Total time on encounter, as per AMA guidelines included both the face-to-face and non-face-to-face time personally spent by the physician and/or other qualified health care professional(s) on the day of the encounter (includes time in activities that require the physician or other qualified health care professional and does not include time in activities normally performed by clinical staff). Physician's time may include the following activities when performed: Preparing to see the patient (e.g., pre-charting review of records, searching for previously ordered imaging, lab work, and nerve conduction tests) Review of prior analgesic pharmacotherapies. Reviewing PMP Interpreting ordered tests  (e.g., lab work, imaging, nerve conduction tests) Performing post-procedure evaluations, including interpretation of diagnostic procedures Obtaining and/or reviewing separately obtained history Performing a medically appropriate examination and/or evaluation Counseling and educating the patient/family/caregiver Ordering medications, tests, or procedures Referring and communicating with other health care professionals (when not separately reported) Documenting clinical information in the electronic or other health record Independently interpreting results (not separately reported) and communicating results to the patient/ family/caregiver Care coordination (not separately reported)  Note by: Edward Jolly, MD Date: 10/02/2022; Time: 2:50 PM

## 2022-10-03 ENCOUNTER — Other Ambulatory Visit: Payer: Self-pay | Admitting: *Deleted

## 2022-10-03 DIAGNOSIS — M47816 Spondylosis without myelopathy or radiculopathy, lumbar region: Secondary | ICD-10-CM

## 2022-10-03 DIAGNOSIS — M48062 Spinal stenosis, lumbar region with neurogenic claudication: Secondary | ICD-10-CM

## 2022-10-03 DIAGNOSIS — M533 Sacrococcygeal disorders, not elsewhere classified: Secondary | ICD-10-CM

## 2022-10-03 DIAGNOSIS — M5136 Other intervertebral disc degeneration, lumbar region: Secondary | ICD-10-CM

## 2022-10-06 ENCOUNTER — Other Ambulatory Visit: Payer: Self-pay | Admitting: Pain Medicine

## 2022-10-06 ENCOUNTER — Other Ambulatory Visit: Payer: Self-pay | Admitting: *Deleted

## 2022-10-06 ENCOUNTER — Telehealth: Payer: Self-pay | Admitting: Student in an Organized Health Care Education/Training Program

## 2022-10-06 DIAGNOSIS — M5136 Other intervertebral disc degeneration, lumbar region: Secondary | ICD-10-CM

## 2022-10-06 DIAGNOSIS — M47816 Spondylosis without myelopathy or radiculopathy, lumbar region: Secondary | ICD-10-CM

## 2022-10-06 DIAGNOSIS — M533 Sacrococcygeal disorders, not elsewhere classified: Secondary | ICD-10-CM

## 2022-10-06 DIAGNOSIS — M48062 Spinal stenosis, lumbar region with neurogenic claudication: Secondary | ICD-10-CM

## 2022-10-06 MED ORDER — TRAMADOL HCL 50 MG PO TABS: 50.0000 mg | ORAL_TABLET | Freq: Three times a day (TID) | ORAL | 2 refills | Status: DC | PRN

## 2022-10-06 MED ORDER — TRAMADOL HCL 50 MG PO TABS: 50.0000 mg | ORAL_TABLET | Freq: Three times a day (TID) | ORAL | 2 refills | Status: AC | PRN

## 2022-10-06 NOTE — Telephone Encounter (Signed)
Rx was printed. Will ask Dr. Laban Emperor to e-scribe.

## 2022-10-06 NOTE — Telephone Encounter (Signed)
PT stated that when she went to pharmacy to pick up her prescription for Tramadol on last Thursday, she was told that she didn't have a prescription to be picked up. Please give patient a call. TY

## 2022-10-06 NOTE — Telephone Encounter (Signed)
Patient notified- Rx sent to pharmacy. 

## 2022-10-06 NOTE — Telephone Encounter (Signed)
Attempted to call patient to inform her that Rx for Tramadol has been sent to pharmacy. No answer, voice mailbox has not been set up.

## 2022-10-13 ENCOUNTER — Ambulatory Visit: Payer: Medicare Other | Admitting: Student in an Organized Health Care Education/Training Program

## 2022-12-23 ENCOUNTER — Ambulatory Visit
Payer: Medicare Other | Attending: Student in an Organized Health Care Education/Training Program | Admitting: Student in an Organized Health Care Education/Training Program

## 2022-12-23 ENCOUNTER — Encounter: Payer: Self-pay | Admitting: Student in an Organized Health Care Education/Training Program

## 2022-12-23 VITALS — BP 140/86 | HR 94 | Temp 97.2°F | Resp 18 | Ht 66.0 in | Wt 244.0 lb

## 2022-12-23 DIAGNOSIS — M7732 Calcaneal spur, left foot: Secondary | ICD-10-CM | POA: Diagnosis present

## 2022-12-23 DIAGNOSIS — M79671 Pain in right foot: Secondary | ICD-10-CM | POA: Insufficient documentation

## 2022-12-23 DIAGNOSIS — M79672 Pain in left foot: Secondary | ICD-10-CM | POA: Insufficient documentation

## 2022-12-23 DIAGNOSIS — M722 Plantar fascial fibromatosis: Secondary | ICD-10-CM | POA: Diagnosis present

## 2022-12-23 NOTE — Progress Notes (Signed)
PROVIDER NOTE: Information contained herein reflects review and annotations entered in association with encounter. Interpretation of such information and data should be left to medically-trained personnel. Information provided to patient can be located elsewhere in the medical record under "Patient Instructions". Document created using STT-dictation technology, any transcriptional errors that may result from process are unintentional.    Patient: Heather Hartman  Service Category: E/M  Provider: Edward Jolly, MD  DOB: 08/17/1950  DOS: 12/23/2022  Referring Provider: Enid Baas, MD  MRN: [id number]  Specialty: Interventional Pain Management  PCP: Enid Baas, MD  Type: Established Patient  Setting: Ambulatory outpatient    Location: Office  Delivery: Face-to-face     HPI  Heather Hartman, a 72 y.o. year old female, is here today because of her Bilateral foot pain [M79.671, M79.672]. Heather Hartman primary complain today is Back Pain  Pertinent problems: Heather Hartman has Chronic radicular lumbar pain; Spinal stenosis, lumbar region, with neurogenic claudication; Lumbar spondylosis; Lumbar degenerative disc disease; Sacroiliac joint pain; Chronic pain syndrome; and Pain management contract signed on their pertinent problem list. Pain Assessment: Severity of Chronic pain is reported as a 8 /10. Location: Foot Right, Left/Denies. Onset: More than a month ago. Quality: Constant, Other (Comment) ("like walking on rocks"). Timing: Constant. Modifying factor(s): Denies. Vitals:  height is 5\' 6"  (1.676 m) and weight is 244 lb (110.7 kg). Her temporal temperature is 97.2 F (36.2 C) (abnormal). Her blood pressure is 140/86 (abnormal) and her pulse is 94. Her respiration is 18 and oxygen saturation is 100%.  BMI: Estimated body mass index is 39.38 kg/m as calculated from the following:   Height as of this encounter: 5\' 6"  (1.676 m).   Weight as of this encounter: 244 lb (110.7 kg). Last encounter:  10/02/2022. Last procedure: 09/10/2022.  Reason for encounter: patient-requested evaluation.   Patient presents today for increased bilateral foot pain.  She has a history of plantar fasciitis and degenerative changes of both feet primarily affecting her inferior calcaneus.  She states that she has seen podiatrist in the past but is hoping for a new referral.  I informed her that I will send to Dr. Allena Katz with podiatry.  She states that her low back and hips are feeling better.  She is status post lumbar facet medial branch nerve blocks on 09/10/2022.  If her low back and hip pain were to return, we discussed repeating nerve block #2 and then considering lumbar radiofrequency ablation or peripheral nerve stimulation of the medial branch nerves.  Patient also has spinal stenosis with neurogenic claudication.  We have discussed minimally invasive lumbar decompression at L4-L5 for lumbar spinal stenosis.  She states that she will think about this further and let us know if she would like to proceed with this potential treatment option.  ROS  Constitutional: Denies any fever or chills Gastrointestinal: No reported hemesis, hematochezia, vomiting, or acute GI distress Musculoskeletal:  Low back pain, bilateral foot pain Neurological: No reported episodes of acute onset apraxia, aphasia, dysarthria, agnosia, amnesia, paralysis, loss of coordination, or loss of consciousness  Medication Review  DULoxetine, acetaminophen, furosemide, lisinopril-hydrochlorothiazide, and traMADol  History Review  Allergy: Heather Hartman is allergic to vioxx [rofecoxib] and atorvastatin. Drug: Heather Hartman  has no history on file for drug use. Alcohol:  has no history on file for alcohol use. Tobacco:  reports that she has been smoking cigarettes. She has a 30 pack-year smoking history. She has never used smokeless tobacco. Social: Heather Hartman  reports  that she has been smoking cigarettes. She has a 30 pack-year smoking history. She has  never used smokeless tobacco. Medical:  has a past medical history of Anxiety, Depression, GERD (gastroesophageal reflux disease), Hyperlipidemia, and Hypertension. Surgical: Heather Hartman  has no past surgical history on file. Family: family history is not on file.  Laboratory Chemistry Profile   Renal No results found for: "BUN", "CREATININE", "LABCREA", "BCR", "GFR", "GFRAA", "GFRNONAA", "LABVMA", "EPIRU", "EPINEPH24HUR", "NOREPRU", "NOREPI24HUR", "DOPARU", "DOPAM24HRUR"  Hepatic No results found for: "AST", "ALT", "ALBUMIN", "ALKPHOS", "HCVAB", "AMYLASE", "LIPASE", "AMMONIA"  Electrolytes No results found for: "NA", "K", "CL", "CALCIUM", "MG", "PHOS"  Bone No results found for: "VD25OH", "VD125OH2TOT", "ZO1096EA5", "WU9811BJ4", "25OHVITD1", "25OHVITD2", "25OHVITD3", "TESTOFREE", "TESTOSTERONE"  Inflammation (CRP: Acute Phase) (ESR: Chronic Phase) No results found for: "CRP", "ESRSEDRATE", "LATICACIDVEN"       Note: Above Lab results reviewed.  Recent Imaging Review   Anatomical Region Laterality Modality  Foot Right -- Radiographic Imaging  Toe Right -- --  Ankle Right -- --  ORTHO Foot Right -- --   Narrative  DP, oblique, and lateral of the right foot taken today and reviewed by myself reveals an inferior spur off the calcaneus.  Degenerative changes are noted mostly at the second metatarsal cuneiform joint in the midfoot but otherwise the joints appear to be well-maintained throughout the rest of the foot.  There is no evidence of any fracture or dislocation.    Anatomical Region Laterality Modality  Foot Left -- Radiographic Imaging  Toe Left -- --  Ankle Left -- --  ORTHO Foot Left -- --   Narrative  DP, oblique, and lateral of the left foot taken today and reviewed by myself reveals an inferior spur off of the calcaneus.  Some degenerative changes are noted mostly around the second metatarsocuneiform joint in the midfoot but otherwise the joints appear to be  well-maintained throughout the rest of the foot.  No evidence of any fracture or dislocatio   Note: Reviewed        Physical Exam  General appearance: Well nourished, well developed, and well hydrated. In no apparent acute distress Mental status: Alert, oriented x 3 (person, place, & time)       Respiratory: No evidence of acute respiratory distress Eyes: PERLA Vitals: BP (!) 140/86   Pulse 94   Temp (!) 97.2 F (36.2 C) (Temporal)   Resp 18   Ht 5\' 6"  (1.676 m)   Wt 244 lb (110.7 kg)   SpO2 100%   BMI 39.38 kg/m  BMI: Estimated body mass index is 39.38 kg/m as calculated from the following:   Height as of this encounter: 5\' 6"  (1.676 m).   Weight as of this encounter: 244 lb (110.7 kg). Ideal: Ideal body weight: 59.3 kg (130 lb 11.7 oz) Adjusted ideal body weight: 79.9 kg (176 lb 0.6 oz)  Assessment   Diagnosis  1. Bilateral foot pain   2. Plantar fasciitis, bilateral   3. Bone spur of posterior portion of left calcaneus      Updated Problems: Problem  Bilateral Foot Pain  Plantar Fasciitis, Bilateral  Bone Spur of Posterior Portion of Left Calcaneus    Plan of Care  Follow-up as needed for repeat lumbar facet medial branch nerve block #2 and then consider RFA versus peripheral nerve stim Consider minimally invasive lumbar decompression for lumbar spinal stenosis with neurogenic claudication Referral to podiatry to help address bilateral foot pain related to arthropathy and plantar fasciitis  Orders:  Orders Placed  This Encounter  Procedures   Ambulatory referral to Podiatry    Referral Priority:   Routine    Referral Type:   Consultation    Referral Reason:   Specialty Services Required    Referred to Provider:   Candelaria Stagers, DPM    Requested Specialty:   Podiatry    Number of Visits Requested:   1   Follow-up plan:   Return for patient will call to schedule F2F appt prn.      L4-5 ESI 01/29/2022, B/L L3,4,5 MBNB #1 09/10/22       Recent  Visits Date Type Provider Dept  10/02/22 Office Visit Edward Jolly, MD [facility]  Showing recent visits within past 90 days and meeting all other requirements Today's Visits Date Type Provider Dept  12/23/22 Office Visit Edward Jolly, MD [facility]  Showing today's visits and meeting all other requirements Future Appointments No visits were found meeting these conditions. Showing future appointments within next 90 days and meeting all other requirements  I discussed the assessment and treatment plan with the patient. The patient was provided an opportunity to ask questions and all were answered. The patient agreed with the plan and demonstrated an understanding of the instructions.  Patient advised to call back or seek an in-person evaluation if the symptoms or condition worsens.  Duration of encounter: .  Total time on encounter, as per AMA guidelines included both the face-to-face and non-face-to-face time personally spent by the physician and/or other qualified health care professional(s) on the day of the encounter (includes time in activities that require the physician or other qualified health care professional and does not include time in activities normally performed by clinical staff). Physician's time may include the following activities when performed: Preparing to see the patient (e.g., pre-charting review of records, searching for previously ordered imaging, lab work, and nerve conduction tests) Review of prior analgesic pharmacotherapies. Reviewing PMP Interpreting ordered tests (e.g., lab work, imaging, nerve conduction tests) Performing post-procedure evaluations, including interpretation of diagnostic procedures Obtaining and/or reviewing separately obtained history Performing a medically appropriate examination and/or evaluation Counseling and educating the patient/family/caregiver Ordering medications, tests, or procedures Referring and  communicating with other health care professionals (when not separately reported) Documenting clinical information in the electronic or other health record Independently interpreting results (not separately reported) and communicating results to the patient/ family/caregiver Care coordination (not separately reported)  Note by: Edward Jolly, MD Date: 12/23/2022; Time: 1:29 PM

## 2022-12-23 NOTE — Progress Notes (Signed)
  Nursing Pain Medication Assessment:  Safety precautions to be maintained throughout the outpatient stay will include: orient to surroundings, keep bed in low position, maintain call bell within reach at all times, provide assistance with transfer out of bed and ambulation.  Medication Inspection Compliance: Pill count conducted under aseptic conditions, in front of the patient. Neither the pills nor the bottle was removed from the patient's sight at any time. Once count was completed pills were immediately returned to the patient in their original bottle.   Medication: Tramadol (Ultram) Pill/Patch Count:  55 of 90 pills remain Pill/Patch Appearance: Markings consistent with prescribed medication Bottle Appearance: Standard pharmacy container. Clearly labeled. Filled Date: 08 / 01 / 2024 Last Medication intake:  Today   Has 2 refills left.

## 2022-12-25 ENCOUNTER — Encounter: Payer: Medicare Other | Admitting: Student in an Organized Health Care Education/Training Program

## 2023-01-01 ENCOUNTER — Ambulatory Visit (INDEPENDENT_AMBULATORY_CARE_PROVIDER_SITE_OTHER): Payer: Medicare Other | Admitting: Podiatry

## 2023-01-01 DIAGNOSIS — M7742 Metatarsalgia, left foot: Secondary | ICD-10-CM | POA: Diagnosis not present

## 2023-01-01 DIAGNOSIS — M7741 Metatarsalgia, right foot: Secondary | ICD-10-CM

## 2023-01-01 NOTE — Progress Notes (Signed)
  Subjective:  Patient ID: Heather Hartman, female    DOB: Sep 24, 1950,  MRN: [id number]  Chief Complaint  Patient presents with   Numbness    Pt stated that her feet feel numb and they just hurt all the time she stated it feels like rocks when she stands     72 y.o. female presents with the above complaint.  Patient presents with complaint bilateral forefoot metatarsalgia.  Patient states that hurts with ambulation feels like her foot is numb.  She states all of her toes and most of the midfoot is numb.  She normally sees pain management she denies any other acute complaints.  She is standing on block.  Pain scale 7 out of 10 dull achy in nature.   Review of Systems: Negative except as noted in the HPI. Denies N/V/F/Ch.  Past Medical History:  Diagnosis Date   Anxiety    Depression    GERD (gastroesophageal reflux disease)    Hyperlipidemia    Hypertension     Current Outpatient Medications:    acetaminophen (TYLENOL) 500 MG tablet, Take 500 mg by mouth every 6 (six) hours as needed., Disp: , Rfl:    DULoxetine (CYMBALTA) 60 MG capsule, Take 1 capsule (60 mg total) by mouth daily., Disp: 30 capsule, Rfl: 2   furosemide (LASIX) 20 MG tablet, Take 20 mg by mouth daily., Disp: , Rfl:    lisinopril-hydrochlorothiazide (ZESTORETIC) 10-12.5 MG tablet, Take 1 tablet by mouth daily., Disp: , Rfl:    traMADol (ULTRAM) 50 MG tablet, Take 1 tablet (50 mg total) by mouth every 8 (eight) hours as needed for severe pain., Disp: 90 tablet, Rfl: 2  Social History   Tobacco Use  Smoking Status Every Day   Current packs/day: 1.00   Average packs/day: 1 pack/day for 30.0 years (30.0 ttl pk-yrs)   Types: Cigarettes  Smokeless Tobacco Never    Allergies  Allergen Reactions   Vioxx [Rofecoxib] Other (See Comments)    Uncoded Allergy. Allergen: viox   Atorvastatin Other (See Comments)    Intolerance - muscle cramps   Objective:  There were no vitals filed for this visit. There is no height  or weight on file to calculate BMI. Constitutional Well developed. Well nourished.  Vascular Dorsalis pedis pulses palpable bilaterally. Posterior tibial pulses palpable bilaterally. Capillary refill normal to all digits.  No cyanosis or clubbing noted. Pedal hair growth normal.  Neurologic Normal speech. Oriented to person, place, and time. Epicritic sensation to light touch grossly present bilaterally.  Dermatologic Bilateral forefoot metatarsalgia noted.  Negative Mulder's click or Lendell Caprice sign noted negative signs for neuroma.  Generalized forefoot metatarsalgia noted with pain across the 2nd through 5th metatarsophalangeal joint.  No extensor flexor tendinitis noted.  Orthopedic: Normal joint ROM without pain or crepitus bilaterally. No visible deformities. No bony tenderness.   Radiographs: None Assessment:   1. Metatarsalgia of both feet    Plan:  Patient was evaluated and treated and all questions answered.  Bilateral forefoot metatarsalgia -All questions and concerns were discussed with the patient in extensive detail.  Given the amount of pain that she is having I discussed with her that she would benefit from metatarsal pads.  Metatarsal pads were dispensed I discussed shoe gear modification extensive detail she states understanding.  If any foot and ankle issues arise in future she will come back and see me.  No follow-ups on file.

## 2023-04-09 IMAGING — CT CT NECK W/O CM
5 series · 16 of 33 positions shown, 18 images · non-contrast
Comparison: None.

CLINICAL DATA: Sore throat and phlegm

EXAM:
CT NECK WITHOUT CONTRAST
TECHNIQUE: Multidetector CT imaging of the neck was performed following the
standard protocol without intravenous contrast.

[Series 2: axial neck neck (person_name) 2.00 · axial · 0.60mm/px · z∈[-654,-582]mm · 2 of 108 slices shown]
[im 36/108  bone]
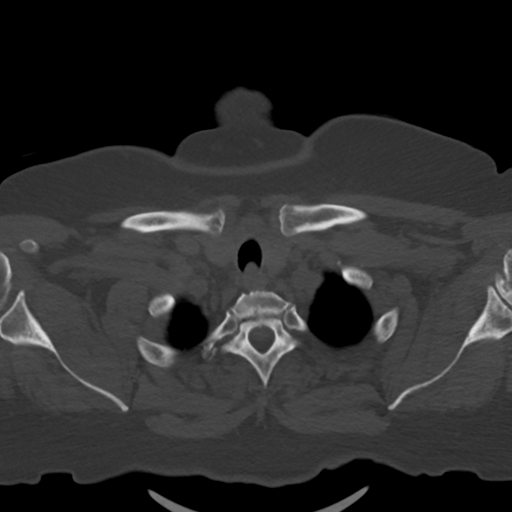
[im 72/108  bone]
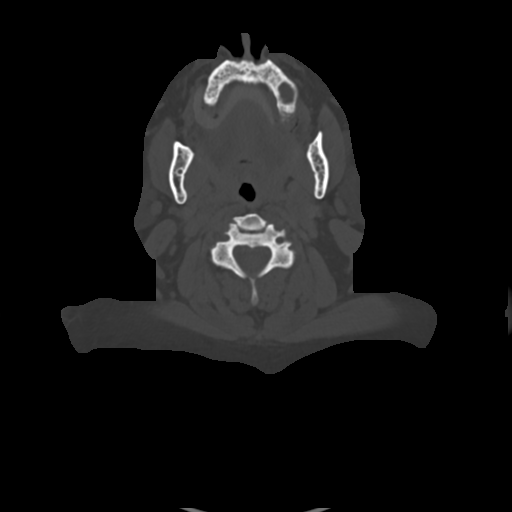

[Series 3: axial bone neck neck 2.00 · axial · 0.60mm/px · z∈[-654,-582]mm · 2 of 108 slices shown]
[im 36/108  bone]
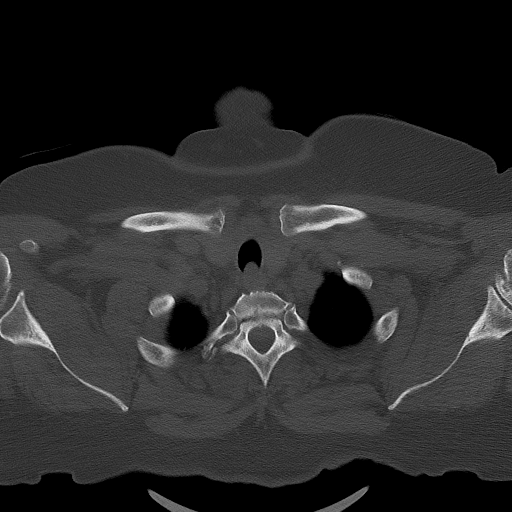
[im 72/108  bone]
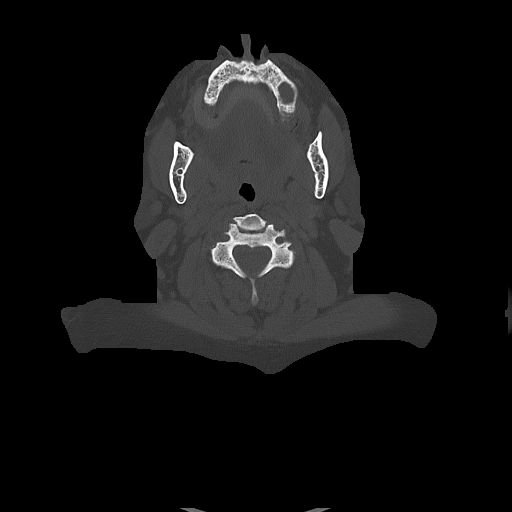

[Series 4: cor neck neck (person_name) 2.00 cor · coronal · 0.60mm/px · 3 of 136 slices shown]
[im 46/136  bone]
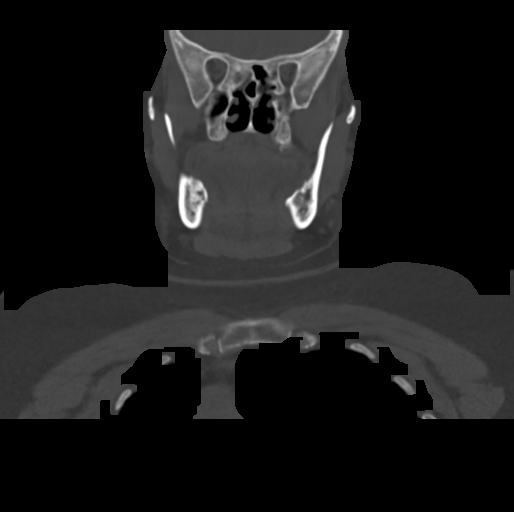
[im 61/136  bone]
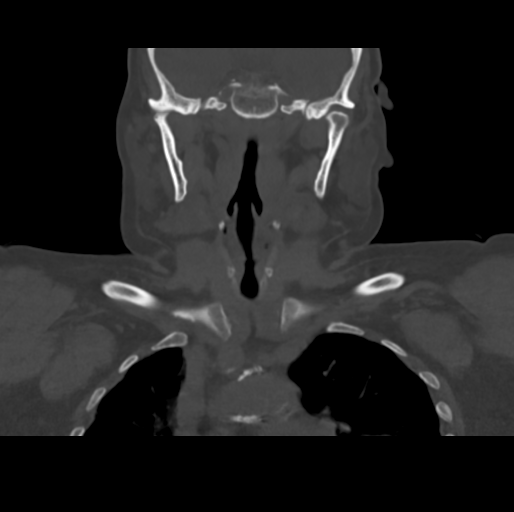
[im 76/136  bone]
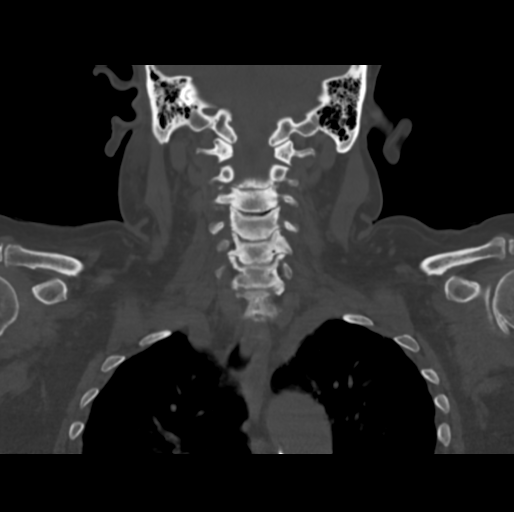

[Series 6: sag neck neck (person_name) 2.00 sag · sagittal · 0.53mm/px · 5 of 153 slices shown, 6 images]
[im 51/153  bone]
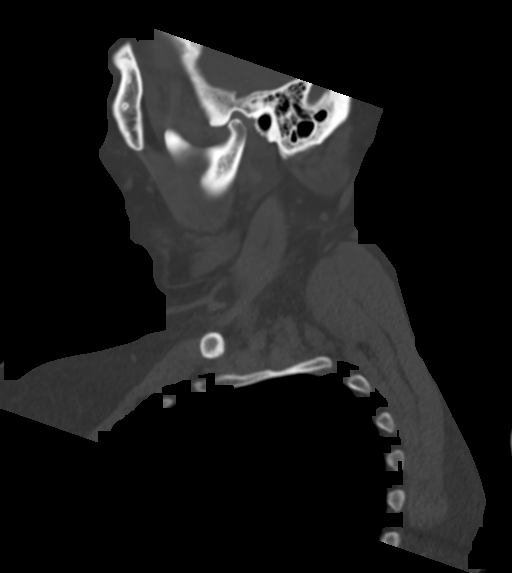
[im 64/153  bone]
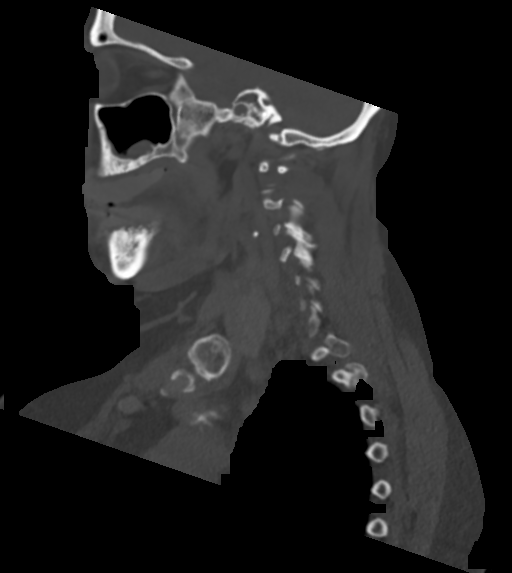
[im 77/153  soft-tissue]
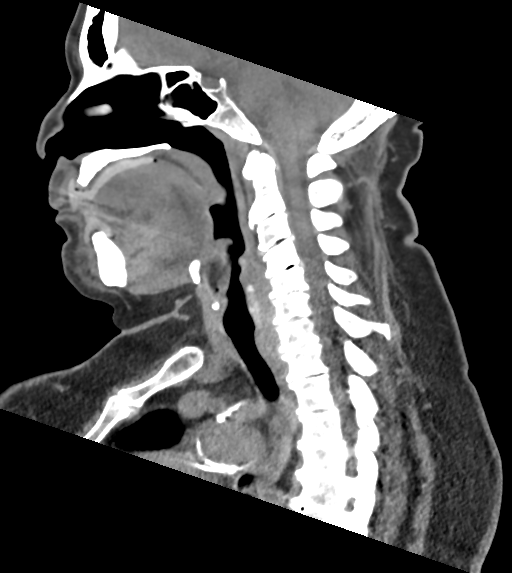
[im 77/153  bone]
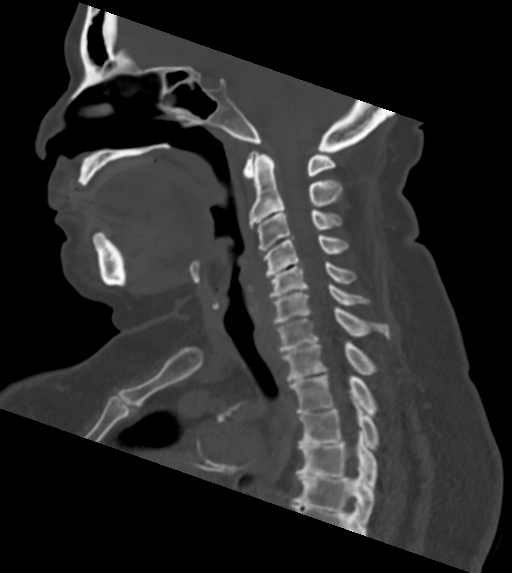
[im 89/153  bone]
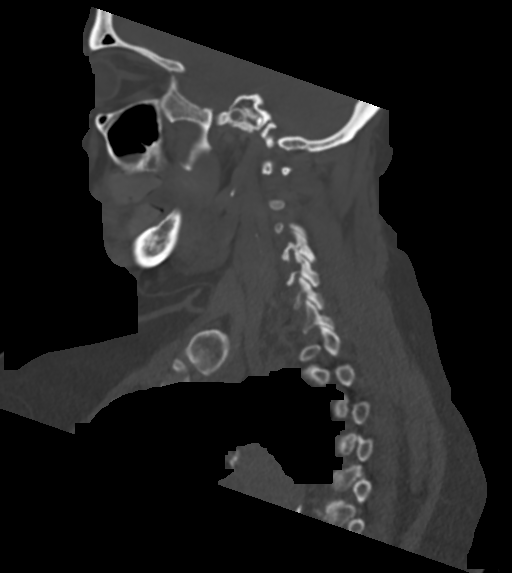
[im 102/153  bone]
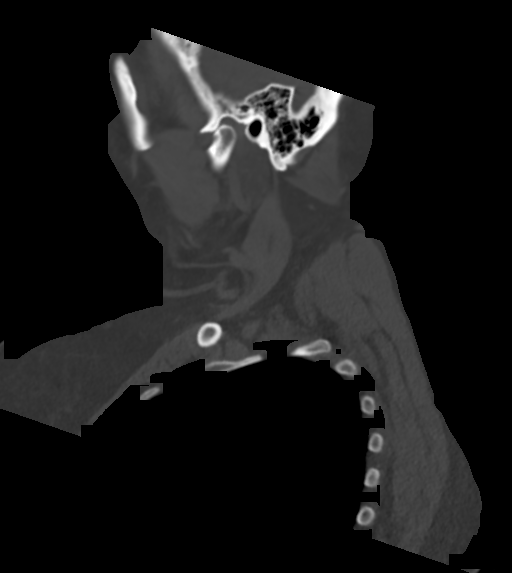

[Series 8: ax oropharynx neck neck (person_name) 2.00 ax · axial · 0.53mm/px · z∈[-752,-580]mm · 4 of 153 slices shown, 5 images]
[im 31/153  soft-tissue]
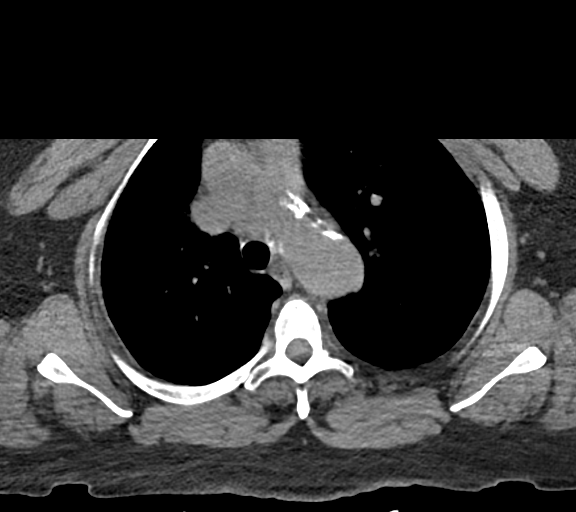
[im 31/153  bone]
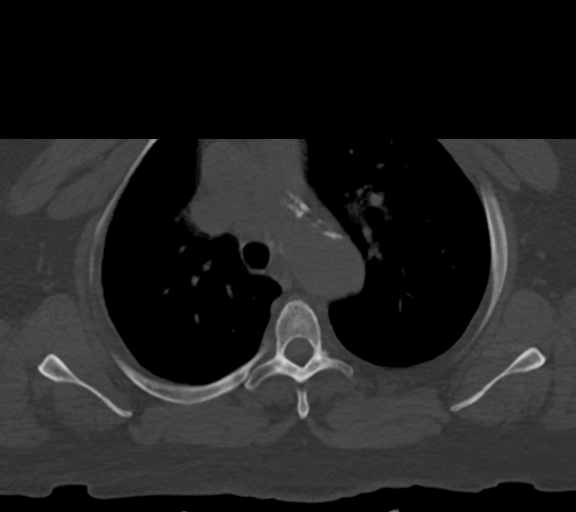
[im 61/153  bone]
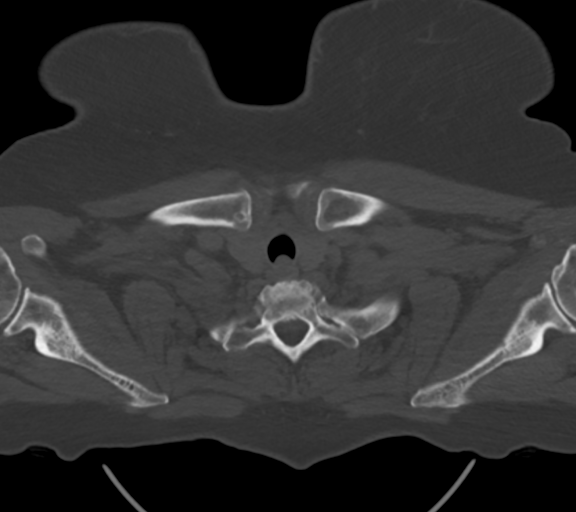
[im 92/153  bone]
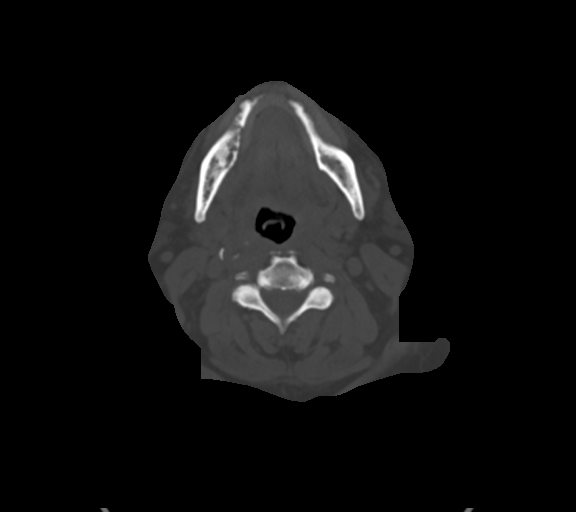
[im 122/153  bone]
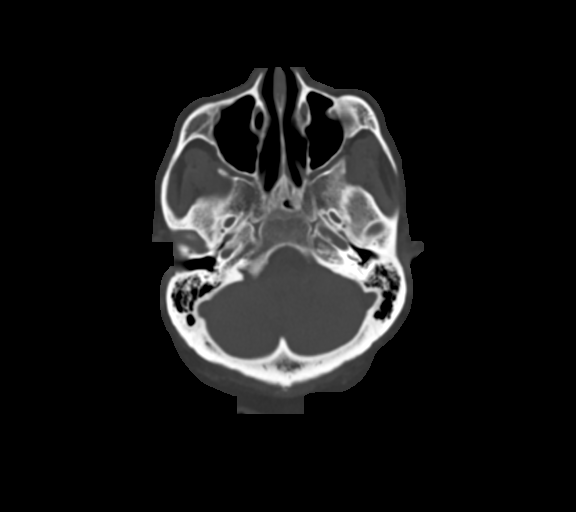

[16 of 33 positions shown; findings below may reference images not displayed]

FINDINGS: Pharynx and larynx: Thickening of the soft palate and uvula, which
collapses the posterior oral cavity, there is nonspecific mild
narrowing of the oropharynx and the left vallecula. The absence of
contrast limits evaluation for focal mass. No low-density collection
is seen.

Salivary glands: No inflammation, mass, or stone.

Thyroid: Normal.

Lymph nodes: None enlarged or abnormal density.

Vascular: Aortic atherosclerotic calcifications. Calcifications at
the bilateral carotid bifurcations.

Limited intracranial: Negative.

Visualized orbits: Negative.

Mastoids and visualized paranasal sinuses: Mucous retention cysts in
the maxillary sinuses. Mild mucosal thickening in the ethmoid air
cells. The mastoid air cells are well aerated.

Skeleton: Degenerative changes in the cervical spine, with
straightening of the normal cervical lordosis. No aggressive osseous
lesion.

Upper chest: Emphysematous changes.  Otherwise negative.

Other: None.
IMPRESSION: Evaluation is somewhat limited in the absence of intravenous
contrast. Within this limitation, there is thickening of the soft
palate and uvula, as well as mild narrowing of the oropharynx and
left vallecula. No focal low-density collection is seen to suggest
abscess or phlegmon. Consider direct visualization or repeat CT with
contrast.
